# Patient Record
Sex: Female | Born: 1980 | Hispanic: Yes | Marital: Married | Smoking: Never smoker
Health system: Southern US, Community
[De-identification: ages and names within clinical notes are randomized; demographics above are authoritative.]

## PROBLEM LIST (undated history)

## (undated) DIAGNOSIS — I10 Essential (primary) hypertension: Secondary | ICD-10-CM

## (undated) DIAGNOSIS — O134 Gestational [pregnancy-induced] hypertension without significant proteinuria, complicating childbirth: Secondary | ICD-10-CM

## (undated) DIAGNOSIS — F419 Anxiety disorder, unspecified: Secondary | ICD-10-CM

## (undated) DIAGNOSIS — M069 Rheumatoid arthritis, unspecified: Secondary | ICD-10-CM

## (undated) DIAGNOSIS — E669 Obesity, unspecified: Secondary | ICD-10-CM

## (undated) DIAGNOSIS — F32A Depression, unspecified: Secondary | ICD-10-CM

## (undated) DIAGNOSIS — F329 Major depressive disorder, single episode, unspecified: Secondary | ICD-10-CM

## (undated) HISTORY — DX: Depression, unspecified: F32.A

## (undated) HISTORY — DX: Major depressive disorder, single episode, unspecified: F32.9

## (undated) HISTORY — DX: Obesity, unspecified: E66.9

## (undated) HISTORY — DX: Gestational (pregnancy-induced) hypertension without significant proteinuria, complicating childbirth: O13.4

---

## 2007-04-12 HISTORY — PX: TUBAL LIGATION: SHX77

## 2018-01-10 ENCOUNTER — Ambulatory Visit: Payer: Self-pay | Admitting: Licensed Clinical Social Worker

## 2018-01-10 DIAGNOSIS — F32A Depression, unspecified: Secondary | ICD-10-CM

## 2018-01-10 DIAGNOSIS — F329 Major depressive disorder, single episode, unspecified: Secondary | ICD-10-CM
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
## 2018-01-23 ENCOUNTER — Ambulatory Visit: Payer: Self-pay | Admitting: Licensed Clinical Social Worker

## 2018-01-23 DIAGNOSIS — F329 Major depressive disorder, single episode, unspecified: Secondary | ICD-10-CM

## 2018-01-23 DIAGNOSIS — F32A Depression, unspecified: Secondary | ICD-10-CM
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
## 2018-02-06 ENCOUNTER — Ambulatory Visit: Payer: Self-pay | Admitting: Licensed Clinical Social Worker

## 2018-02-06 DIAGNOSIS — F32A Depression, unspecified: Secondary | ICD-10-CM

## 2018-02-06 DIAGNOSIS — F329 Major depressive disorder, single episode, unspecified: Secondary | ICD-10-CM
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
## 2018-02-10 ENCOUNTER — Emergency Department (HOSPITAL_COMMUNITY): Payer: Self-pay

## 2018-02-10 ENCOUNTER — Other Ambulatory Visit: Payer: Self-pay

## 2018-02-10 ENCOUNTER — Emergency Department (HOSPITAL_COMMUNITY)
Admission: EM | Admit: 2018-02-10 | Discharge: 2018-02-10 | Disposition: A | Discharge status: Home / Self Care | Payer: Self-pay | Attending: Emergency Medicine | Admitting: Emergency Medicine

## 2018-02-10 ENCOUNTER — Encounter (HOSPITAL_COMMUNITY): Payer: Self-pay | Admitting: Emergency Medicine

## 2018-02-10 DIAGNOSIS — R110 Nausea: Secondary | ICD-10-CM | POA: Insufficient documentation

## 2018-02-10 DIAGNOSIS — I10 Essential (primary) hypertension: Secondary | ICD-10-CM | POA: Insufficient documentation

## 2018-02-10 DIAGNOSIS — R1084 Generalized abdominal pain: Secondary | ICD-10-CM | POA: Insufficient documentation

## 2018-02-10 HISTORY — DX: Essential (primary) hypertension: I10

## 2018-02-10 LAB — URINALYSIS, ROUTINE W REFLEX MICROSCOPIC
Bilirubin Urine: NEGATIVE
GLUCOSE, UA: NEGATIVE mg/dL
HGB URINE DIPSTICK: NEGATIVE
Ketones, ur: NEGATIVE mg/dL
NITRITE: NEGATIVE
PH: 6 (ref 5.0–8.0)
PROTEIN: NEGATIVE mg/dL
Specific Gravity, Urine: 1.012 (ref 1.005–1.030)

## 2018-02-10 LAB — CBC
HCT: 30.5 % — ABNORMAL LOW (ref 36.0–46.0)
HEMOGLOBIN: 8.4 g/dL — AB (ref 12.0–15.0)
MCH: 17.6 pg — ABNORMAL LOW (ref 26.0–34.0)
MCHC: 27.5 g/dL — ABNORMAL LOW (ref 30.0–36.0)
MCV: 64.1 fL — ABNORMAL LOW (ref 80.0–100.0)
PLATELETS: 310 10*3/uL (ref 150–400)
RBC: 4.76 MIL/uL (ref 3.87–5.11)
RDW: 19.3 % — ABNORMAL HIGH (ref 11.5–15.5)
WBC: 8.6 10*3/uL (ref 4.0–10.5)
nRBC: 0 % (ref 0.0–0.2)

## 2018-02-10 LAB — I-STAT BETA HCG BLOOD, ED (MC, WL, AP ONLY): I-stat hCG, quantitative: <5 m[IU]/mL (ref <5)

## 2018-02-10 LAB — COMPREHENSIVE METABOLIC PANEL
ALT: 19 U/L (ref 0–44)
ANION GAP: 8 (ref 5–15)
AST: 19 U/L (ref 15–41)
Albumin: 4.1 g/dL (ref 3.5–5.0)
Alkaline Phosphatase: 86 U/L (ref 38–126)
BILIRUBIN TOTAL: 0.4 mg/dL (ref 0.3–1.2)
BUN: 12 mg/dL (ref 6–20)
CALCIUM: 8.9 mg/dL (ref 8.9–10.3)
CO2: 24 mmol/L (ref 22–32)
Chloride: 105 mmol/L (ref 98–111)
Creatinine, Ser: 0.68 mg/dL (ref 0.44–1.00)
GFR calc Af Amer: >60 mL/min (ref >60)
Glucose, Bld: 92 mg/dL (ref 70–99)
POTASSIUM: 3.8 mmol/L (ref 3.5–5.1)
Sodium: 137 mmol/L (ref 135–145)
TOTAL PROTEIN: 8.3 g/dL — AB (ref 6.5–8.1)

## 2018-02-10 LAB — LIPASE, BLOOD: Lipase: 41 U/L (ref 11–51)

## 2018-02-10 MED ORDER — IOPAMIDOL (ISOVUE-300) INJECTION 61%
INTRAVENOUS | Status: AC
  Filled 2018-02-10: qty 100

## 2018-02-10 MED ORDER — SUCRALFATE 1 G PO TABS: 1.0000 g | ORAL_TABLET | Freq: Three times a day (TID) | ORAL | 0 refills | Status: DC

## 2018-02-10 MED ORDER — ONDANSETRON 4 MG PO TBDP: 4.0000 mg | ORAL_TABLET | Freq: Three times a day (TID) | ORAL | 0 refills | Status: DC | PRN

## 2018-02-10 MED ORDER — ONDANSETRON HCL 4 MG/2ML IJ SOLN
4.0000 mg | Freq: Once | INTRAMUSCULAR | Status: AC
  Administered 2018-02-10: 4 mg via INTRAVENOUS
  Filled 2018-02-10: qty 2

## 2018-02-10 MED ORDER — MORPHINE SULFATE (PF) 4 MG/ML IV SOLN
4.0000 mg | Freq: Once | INTRAVENOUS | Status: AC
  Administered 2018-02-10: 4 mg via INTRAVENOUS
  Filled 2018-02-10: qty 1

## 2018-02-10 MED ORDER — IOPAMIDOL (ISOVUE-300) INJECTION 61%
100.0000 mL | Freq: Once | INTRAVENOUS | Status: AC | PRN
  Administered 2018-02-10: 100 mL via INTRAVENOUS

## 2018-02-10 MED ORDER — SODIUM CHLORIDE 0.9 % IV BOLUS
1000.0000 mL | Freq: Once | INTRAVENOUS | Status: AC
  Administered 2018-02-10: 1000 mL via INTRAVENOUS

## 2018-02-10 MED ORDER — SODIUM CHLORIDE 0.9 % IJ SOLN
INTRAMUSCULAR | Status: AC
  Filled 2018-02-10: qty 50

## 2018-02-10 NOTE — ED Provider Notes (Signed)
[facility] DEPT Provider Note   CSN: [id number] Arrival date & time: 02/10/18  1058   History   Chief Complaint Chief Complaint  Patient presents with  . Abdominal Pain    HPI Janet Cardenas is a 37 y.o. female with past medical history significant for hypertension who presents for evaluation of pain and nausea.  Patient states this has been going on intermittently over the last 4 months.  Patient states she was seen approximately 3 months ago and given omeprazole.  Patient states at that time that resolved her symptoms.  She states her pain " will go away."  Pain has not changed over the last 4 months.  Rates her pain a 5/10.  Denies radiation of pain.  States her pain located "all over her stomach."  Denies aggravating or alleviating factors.  Pain does not get worse with eating.  Denies fever, chills, vomiting, headache, dizziness, lightheadedness, cough, hemoptysis, diarrhea, constipation, melena, bright red blood per rectum, dysuria, vaginal discharge.  History obtained from patient and family members.  Medical interpreter was used.  HPI  Past Medical History:  Diagnosis Date  . Hypertension     There are no active problems to display for this patient.   History reviewed. No pertinent surgical history.   OB History   None      Home Medications    Prior to Admission medications   Medication Sig Start Date End Date Taking? Authorizing Provider  ibuprofen (ADVIL,MOTRIN) 200 MG tablet Take 400 mg by mouth every 6 (six) hours as needed for mild pain.   Yes [provider]  ondansetron (ZOFRAN ODT) 4 MG disintegrating tablet Take 1 tablet (4 mg total) by mouth every 8 (eight) hours as needed for nausea or vomiting. 02/10/18   Vedh Ptacek A, PA-C  sucralfate (CARAFATE) 1 g tablet Take 1 tablet (1 g total) by mouth 4 (four) times daily -  with meals and at bedtime. 02/10/18   Toddy Boyd A, PA-C    Family  History History reviewed. No pertinent family history.  Social History Social History   Tobacco Use  . Smoking status: Never Smoker  . Smokeless tobacco: Never Used  Substance Use Topics  . Alcohol use: Not Currently  . Drug use: Never     Allergies   Patient has no known allergies.   Review of Systems Review of Systems  Constitutional: Negative.   HENT: Negative.   Respiratory: Negative.   Cardiovascular: Negative.   Gastrointestinal: Positive for abdominal pain and nausea. Negative for abdominal distention, anal bleeding, blood in stool, constipation, diarrhea, rectal pain and vomiting.  Genitourinary: Negative.   Musculoskeletal: Negative.   Skin: Negative.   Neurological: Negative.   All other systems reviewed and are negative.    Physical Exam Updated Vital Signs BP 116/70   Pulse 62   Temp 97.7 F (36.5 C) (Oral)   Resp 14   Ht _0  (1.626 m)   Wt 99.8 kg   LMP 01/18/2018 (Exact Date) Comment: neg hcg  SpO2 100%   BMI 37.76 kg/m   Physical Exam  Constitutional: She appears well-developed and well-nourished.  Non-toxic appearance. She does not appear ill. No distress.  HENT:  Head: Atraumatic.  Mouth/Throat: Oropharynx is clear and moist.  Eyes: Pupils are equal, round, and reactive to light.  Neck: Normal range of motion.  Cardiovascular: Normal rate, regular rhythm, normal heart sounds and intact distal pulses. Exam reveals no gallop and no friction  rub.  No murmur heard. Pulmonary/Chest: Effort normal and breath sounds normal. No stridor. No respiratory distress. She has no wheezes. She has no rhonchi. She has no rales. She exhibits no tenderness.  Abdominal: Soft. Normal appearance and bowel sounds are normal. She exhibits no shifting dullness, no distension, no pulsatile liver, no fluid wave, no abdominal bruit, no ascites, no pulsatile midline mass and no mass. There is no hepatosplenomegaly. There is generalized tenderness. There is no rigidity,  no rebound, no guarding, no CVA tenderness, no tenderness at McBurney's point and negative Murphy's sign. No hernia.  Musculoskeletal: Normal range of motion.  Neurological: She is alert.  Skin: Skin is warm and dry.  Psychiatric: She has a normal mood and affect.  Nursing note and vitals reviewed.    ED Treatments / Results  Labs (all labs ordered are listed, but only abnormal results are displayed) Labs Reviewed  COMPREHENSIVE METABOLIC PANEL - Abnormal; Notable for the following components:      Result Value   Total Protein 8.3 (*)    All other components within normal limits  CBC - Abnormal; Notable for the following components:   Hemoglobin 8.4 (*)    HCT 30.5 (*)    MCV 64.1 (*)    MCH 17.6 (*)    MCHC 27.5 (*)    RDW 19.3 (*)    All other components within normal limits  URINALYSIS, ROUTINE W REFLEX MICROSCOPIC - Abnormal; Notable for the following components:   APPearance HAZY (*)    Leukocytes, UA SMALL (*)    Bacteria, UA RARE (*)    All other components within normal limits  URINE CULTURE  LIPASE, BLOOD  I-STAT BETA HCG BLOOD, ED ([facility], [facility], [facility] ONLY)    EKG None  Radiology Ct Abdomen Pelvis W Contrast  Result Date: 02/10/2018 CLINICAL DATA:  Acute generalized abdominal pain EXAM: CT ABDOMEN AND PELVIS WITH CONTRAST TECHNIQUE: Multidetector CT imaging of the abdomen and pelvis was performed using the standard protocol following bolus administration of intravenous contrast. CONTRAST:  138m ISOVUE-300 IOPAMIDOL (ISOVUE-300) INJECTION 61% COMPARISON:  None. FINDINGS: Lower chest: Multiple calcified bilateral lobe pulmonary nodule likely reflecting sequela of prior granulomatous disease. Hepatobiliary: No focal liver abnormality is seen. No gallstones, gallbladder wall thickening, or biliary dilatation. Pancreas: Unremarkable. No pancreatic ductal dilatation or surrounding inflammatory changes. Spleen: Normal in size without focal abnormality. Adrenals/Urinary Tract:  Adrenal glands are unremarkable. No urolithiasis or obstructive uropathy. No renal mass. Atrophic left kidney with scarring. Bladder is unremarkable. Stomach/Bowel: Stomach is within normal limits. Appendix appears normal. No evidence of bowel wall thickening, distention, or inflammatory changes. Vascular/Lymphatic: No significant vascular findings are present. No enlarged abdominal or pelvic lymph nodes. Reproductive: Uterus and bilateral adnexa are unremarkable. Other: No abdominal wall hernia or abnormality. No abdominopelvic ascites. Musculoskeletal: No acute osseous abnormality. No aggressive osseous lesion. IMPRESSION: 1. No acute abdominal or pelvic pathology. 2. Atrophic left kidney. Electronically Signed   By: HKathreen Devoid  On: 02/10/2018 14:43    Procedures Procedures (including critical care time)  Medications Ordered in ED Medications  iopamidol (ISOVUE-300) 61 % injection (has no administration in time range)  sodium chloride 0.9 % injection (has no administration in time range)  sodium chloride 0.9 % bolus 1,000 mL (0 mLs Intravenous Stopped 02/10/18 1632)  ondansetron (ZOFRAN) injection 4 mg (4 mg Intravenous Given 02/10/18 1314)  morphine 4 MG/ML injection 4 mg (4 mg Intravenous Given 02/10/18 1314)  iopamidol (ISOVUE-300) 61 % injection 100 mL (  100 mLs Intravenous Contrast Given 02/10/18 1349)     Initial Impression / Assessment and Plan / ED Course  I have reviewed the triage vital signs and the nursing notes.  Pertinent labs & imaging results that were available during my care of the patient were reviewed by me and considered in my medical decision making (see chart for details).  37 year old female who appears otherwise well presents for evaluation of abdominal pain and nausea.  Afebrile, nonseptic, non-ill-appearing.  History of similar symptoms over the last 4 months.  Denies aggravating or alleviating factors.  Was seen approximately 3 months ago and was given omeprazole.   Her symptoms resolved at that time.  Mild generalized abdominal pain.  No focal pain.  Negative Murphy sign, negative McBurney's point. Symptoms do not change with food intake.  Patient in no apparent distress.  Patient's pain and other symptoms adequately managed in emergency department.  Fluid bolus given.  Labs, imaging and vitals reviewed.  Labs without leukocytosis, Hemoglobin 8.4, Patient states she has heavy menstrual cycles and has chronic anemia. She is supposed to be taking iron supplementation however does not take on a regular basis. Discussed with her resuming iron supplementation. Denies dizziness, lightheadedness. Denies melena or BRBPR. Lipase 41, HCG negative, metabolic panel without any electrolyte abnormalities, normal kidney and liver function, No elevation in AST, ALT, alk phos or bilirubin. Urine with small leukocytes and rare bacteria. No urinary symptoms. Will send culture.  Patient able to tolerate PO intake in the department without difficulty.  Patient does not meet SIRS or Sepsis criteria.  On repeat exam patient does not have a surgical abdomin and there are no peritoneal signs.  No indication of appendicitis, bowel obstruction, bowel perforation, cholecystitis, diverticulitis, PID or ectopic pregnancy.  Patient discharged home with symptomatic treatment and given strict instructions for follow-up with their primary care physician.  I have also discussed reasons to return immediately to the ER.  Stable for dc home at this time. Patient expresses understanding and agrees with plan.   Treatment and plan discussed with patient and family via medical interpretor.   Final Clinical Impressions(s) / ED Diagnoses   Final diagnoses:  Generalized abdominal pain  Nausea    ED Discharge Orders         Ordered    ondansetron (ZOFRAN ODT) 4 MG disintegrating tablet  Every 8 hours PRN     02/10/18 1625    sucralfate (CARAFATE) 1 g tablet  3 times daily with meals & bedtime      02/10/18 1625           Schyler Butikofer A, PA-C 02/10/18 1638    Davonna Belling, MD 02/11/18 1554

## 2018-02-10 NOTE — Discharge Instructions (Signed)
You were evaluated today for nausea and abdominal pain.  Your CT scan was negative. Your lab work was negative. We have cultured your Urine and we will call you if it is positive. Please take the medications we have prescribed you.  Follow-up with the community clinic we have referred  you too.  Return to the ED with any new or worsening symptoms

## 2018-02-10 NOTE — ED Notes (Signed)
EDPA Provider at bedside. 

## 2018-02-10 NOTE — ED Triage Notes (Signed)
(  Interpreter service used) Pt c/o abdominal pain at umbilicus. Pt reports intermittent pain that causes vomiting and dizziness. Pt states she has had this pain x 4 months and was seen for it and given amoxicillin, flagyl and omeprazole and pain has returned. Pt states she was pain free while taking meds, but pain returns when off of meds.

## 2018-02-13 LAB — URINE CULTURE: Culture: 70000 — AB

## 2018-02-14 ENCOUNTER — Telehealth: Payer: Self-pay | Admitting: Emergency Medicine

## 2018-02-14 NOTE — Telephone Encounter (Signed)
Post ED Visit - Positive Culture Follow-up  Culture report reviewed by antimicrobial stewardship pharmacist:  []  Enzo Bi, Pharm.D. []  Celedonio Miyamoto, Pharm.D., BCPS AQ-ID []  Garvin Fila, Pharm.D., BCPS []  Georgina Pillion, Pharm.D., BCPS []  [place], [address].D., BCPS, AAHIVP []  Estella Husk, Pharm.D., BCPS, AAHIVP []  Lysle Pearl, PharmD, BCPS []  Phillips Climes, PharmD, BCPS []  Agapito Games, PharmD, BCPS []  Verlan Friends, PharmD Michel Harrow PharmD  Positive urine culture Treated with none, asymptomatic, organism sensitive to the same and no further patient follow-up is required at this time.  Berle Mull 02/14/2018, 10:29 AM

## 2018-02-14 NOTE — Progress Notes (Signed)
ED Antimicrobial Stewardship Positive Culture Follow Up   Janet Cardenas is an 37 y.o. female who presented to [facility] on 02/10/2018 with a chief complaint of  Chief Complaint  Patient presents with  . Abdominal Pain    Recent Results (from the past 720 hour(s))  Urine culture     Status: Abnormal   Collection Time: 02/10/18 12:08 PM  Result Value Ref Range Status   Specimen Description   Final    URINE, CLEAN CATCH Performed at [facility], [address], [place], [place] [address]    Special Requests   Final    NONE Performed at [facility], [address] [place], [place] [address]    Culture (A)  Final    70,000 COLONIES/mL ESCHERICHIA COLI Confirmed Extended Spectrum Beta-Lactamase Producer (ESBL).  In bloodstream infections from ESBL organisms, carbapenems are preferred over piperacillin/tazobactam. They are shown to have a lower risk of mortality.    Report Status 02/13/2018 FINAL  Final   Organism ID, Bacteria ESCHERICHIA COLI (A)  Final      Susceptibility   Escherichia coli - MIC*    AMPICILLIN >=32 RESISTANT Resistant     CEFAZOLIN >=64 RESISTANT Resistant     CEFTRIAXONE >=64 RESISTANT Resistant     CIPROFLOXACIN >=4 RESISTANT Resistant     GENTAMICIN >=16 RESISTANT Resistant     IMIPENEM <=0.25 SENSITIVE Sensitive     NITROFURANTOIN <=16 SENSITIVE Sensitive     TRIMETH/SULFA <=20 SENSITIVE Sensitive     AMPICILLIN/SULBACTAM >=32 RESISTANT Resistant     PIP/TAZO 8 SENSITIVE Sensitive     Extended ESBL POSITIVE Resistant     * 70,000 COLONIES/mL ESCHERICHIA COLI    [x]  Patient discharged originally without antimicrobial agent and treatment is not indicated  New antibiotic prescription: Not applicable, no urinary symptoms, no treatment needed. Asymptomatic Bacturia.   ED Provider: Frederik Pear   Thank you for allowing pharmacy to be a part of this patient's care.  Bradley Ferris, PharmD 02/14/2018  9:57 AM PGY-1 Pharmacy Resident Monday - Friday phone -  [phone] Saturday - Sunday phone - [phone]

## 2018-03-04 ENCOUNTER — Encounter: Payer: Self-pay | Admitting: Internal Medicine

## 2018-03-05 ENCOUNTER — Ambulatory Visit: Payer: Self-pay | Admitting: Internal Medicine

## 2018-03-05 ENCOUNTER — Encounter: Payer: Self-pay | Admitting: Internal Medicine

## 2018-03-05 VITALS — BP 124/80 | HR 82 | Resp 12 | Ht 64.0 in | Wt 219.0 lb

## 2018-03-05 DIAGNOSIS — G8929 Other chronic pain: Secondary | ICD-10-CM

## 2018-03-05 DIAGNOSIS — E669 Obesity, unspecified: Secondary | ICD-10-CM | POA: Insufficient documentation

## 2018-03-05 DIAGNOSIS — Z23 Encounter for immunization: Secondary | ICD-10-CM

## 2018-03-05 DIAGNOSIS — M25562 Pain in left knee: Secondary | ICD-10-CM

## 2018-03-05 MED ORDER — MELOXICAM 15 MG PO TABS: 15.0000 mg | ORAL_TABLET | Freq: Every day | ORAL | 4 refills | Status: DC

## 2018-03-05 NOTE — Progress Notes (Signed)
Subjective:   Patient ID: Janet Cardenas, female    DOB: 12/15/1980, 37 y.o.   MRN: [id number]  HPI   Here to establish.  1.  Left knee pain: Has been a problem for about 1 year.  Has been using some sort of OTC ointment.  Has Menthol in it and helps.  Uses only with pain.  Generally 2-3 times weekly. Also, uses ibuprofen 400 mg maybe twice weekly for the pain as well--also helps. Hurts more after sitting for prolonged period of time. No history of injury.   Entire anterior knee hurts and medial joint line in particular.   Does not kneel or deep knee bend much.  Describes a burning sensation in her knee as well as a tightness at times.   No sense of "catching" in her knee. Knee does swell at times.    2.  Obesity:   3 children:  38 yo, 38 yo, 38 yo.  Diet:  Up at 7 a.m.  Eats first around 10 a.m.:  Eats pan dulce and coffee with milk after dropping off daughter at bus stop.  1 p.m.:  Rice with chicken, beans, no vegetables. Drinks water.  Will have a melon smoothie:  Melon, banana, whole milk.  6-7 p.m.:  Eats whatever she ate at 1 p.m.  Does have a vegetable and fried chicken.   Water.    Does not eat after dinner.   2.  History of depression after coming here from [place].  She feels depressed at times.   At times, would like to go back to [place]. Has not made community here.   Has not been able to learn English and is not really motivated to do so as she wants to go back to her country of origin.  Current Meds  Medication Sig  . ibuprofen (ADVIL,MOTRIN) 200 MG tablet Take 400 mg by mouth every 6 (six) hours as needed for mild pain.  . Iron-FA-B Cmp-C-Biot-Probiotic (FUSION PLUS) CAPS Take 1 capsule by mouth daily.  . ondansetron (ZOFRAN ODT) 4 MG disintegrating tablet Take 1 tablet (4 mg total) by mouth every 8 (eight) hours as needed for nausea or vomiting.  . sucralfate (CARAFATE) 1 g tablet Take 1 tablet (1 g total) by mouth 4 (four)  times daily -  with meals and at bedtime.   No Known Allergies   Past Medical History:  Diagnosis Date  . Depression   . Hypertension   . Obesity (BMI 35.0-39.9 without comorbidity)    No past surgical history on file.   Social History   Socioeconomic History  . Marital status: Married    Spouse name: Not on file  . Number of children: Not on file  . Years of education: Not on file  . Highest education level: Not on file  Occupational History  . Not on file  Social Needs  . Financial resource strain: Not on file  . Food insecurity:    Worry: Not on file    Inability: Not on file  . Transportation needs:    Medical: Not on file    Non-medical: Not on file  Tobacco Use  . Smoking status: Never Smoker  . Smokeless tobacco: Never Used  Substance and Sexual Activity  . Alcohol use: Not Currently  . Drug use: Never  . Sexual activity: Not on file  Lifestyle  . Physical activity:    Days per week: Not on file    Minutes per session:  Not on file  . Stress: Not on file  Relationships  . Social connections:    Talks on phone: Not on file    Gets together: Not on file    Attends religious service: Not on file    Active member of club or organization: Not on file    Attends meetings of clubs or organizations: Not on file    Relationship status: Not on file  . Intimate partner violence:    Fear of current or ex partner: Not on file    Emotionally abused: Not on file    Physically abused: Not on file    Forced sexual activity: Not on file  Other Topics Concern  . Not on file  Social History Narrative   Lives at home with husband and children.       Review of Systems    Objective:  Physical Exam  Obese, NAD HEENT: PERRL, EOMI Neck:  Supple, No adenopathy, no thyromegaly Chest:  CTA CV: RRR without murmur or rub. Radial pulses normal and equal Left knee with joint line tenderness at collateral ligament both medially and laterally.  Tender along anterior  lateral joint line and entire medial joint line as well.  No effusion.  NT on palpation of patella. Full ROM     Assessment & Plan:  1.  Left knee pain:  Send for Xrays.  Meloxicam 15 mg daily with meal for pain as needed. To work on lifestyle changes for weight loss.  Consider swimming or stationary bike with knee complaints.  Discussed would help with knee pain.  2.  HM:  Tdap today.  3.  History of Anemia:  followup in 1 month for CBC  4.  Obesity:  To work on weight loss.

## 2018-03-05 NOTE — Patient Instructions (Signed)

## 2018-04-06 ENCOUNTER — Other Ambulatory Visit: Payer: Self-pay

## 2018-04-06 DIAGNOSIS — D649 Anemia, unspecified: Secondary | ICD-10-CM

## 2018-04-07 LAB — CBC WITH DIFFERENTIAL/PLATELET
BASOS: 0 %
Basophils Absolute: 0 10*3/uL (ref 0.0–0.2)
EOS (ABSOLUTE): 0.2 10*3/uL (ref 0.0–0.4)
Eos: 3 %
Hematocrit: 30.1 % — ABNORMAL LOW (ref 34.0–46.6)
Hemoglobin: 9.2 g/dL — ABNORMAL LOW (ref 11.1–15.9)
IMMATURE GRANULOCYTES: 0 %
Immature Grans (Abs): 0 10*3/uL (ref 0.0–0.1)
LYMPHS ABS: 2 10*3/uL (ref 0.7–3.1)
Lymphs: 25 %
MCH: 19.7 pg — ABNORMAL LOW (ref 26.6–33.0)
MCHC: 30.6 g/dL — AB (ref 31.5–35.7)
MCV: 64 fL — ABNORMAL LOW (ref 79–97)
MONOS ABS: 0.5 10*3/uL (ref 0.1–0.9)
Monocytes: 6 %
NEUTROS PCT: 66 %
Neutrophils Absolute: 5.3 10*3/uL (ref 1.4–7.0)
PLATELETS: 304 10*3/uL (ref 150–450)
RBC: 4.68 x10E6/uL (ref 3.77–5.28)
RDW: 20.5 % — AB (ref 12.3–15.4)
WBC: 8.1 10*3/uL (ref 3.4–10.8)

## 2018-05-10 ENCOUNTER — Other Ambulatory Visit: Payer: Self-pay

## 2018-05-14 ENCOUNTER — Other Ambulatory Visit: Payer: Self-pay

## 2018-05-15 ENCOUNTER — Other Ambulatory Visit: Payer: Self-pay

## 2018-05-15 DIAGNOSIS — D649 Anemia, unspecified: Secondary | ICD-10-CM

## 2018-05-16 LAB — CBC WITH DIFFERENTIAL/PLATELET
BASOS: 0 %
Basophils Absolute: 0 10*3/uL (ref 0.0–0.2)
EOS (ABSOLUTE): 0.1 10*3/uL (ref 0.0–0.4)
EOS: 2 %
HEMATOCRIT: 36.8 % (ref 34.0–46.6)
Hemoglobin: 11.4 g/dL (ref 11.1–15.9)
IMMATURE GRANS (ABS): 0 10*3/uL (ref 0.0–0.1)
Immature Granulocytes: 0 %
LYMPHS: 27 %
Lymphocytes Absolute: 1.7 10*3/uL (ref 0.7–3.1)
MCH: 22.1 pg — ABNORMAL LOW (ref 26.6–33.0)
MCHC: 31 g/dL — ABNORMAL LOW (ref 31.5–35.7)
MCV: 71 fL — AB (ref 79–97)
MONOCYTES: 5 %
Monocytes Absolute: 0.3 10*3/uL (ref 0.1–0.9)
NEUTROS ABS: 4 10*3/uL (ref 1.4–7.0)
Neutrophils: 66 %
Platelets: 305 10*3/uL (ref 150–450)
RBC: 5.16 x10E6/uL (ref 3.77–5.28)
RDW: 22.1 % — ABNORMAL HIGH (ref 11.7–15.4)
WBC: 6.2 10*3/uL (ref 3.4–10.8)

## 2018-06-07 ENCOUNTER — Ambulatory Visit: Payer: Self-pay | Admitting: Internal Medicine

## 2018-06-27 ENCOUNTER — Other Ambulatory Visit: Payer: Self-pay

## 2018-07-11 ENCOUNTER — Other Ambulatory Visit: Payer: Self-pay

## 2018-08-10 ENCOUNTER — Other Ambulatory Visit: Payer: Self-pay

## 2018-08-10 DIAGNOSIS — D649 Anemia, unspecified: Secondary | ICD-10-CM

## 2018-08-11 LAB — CBC WITH DIFFERENTIAL/PLATELET
Basophils Absolute: 0 10*3/uL (ref 0.0–0.2)
Basos: 0 %
EOS (ABSOLUTE): 0.2 10*3/uL (ref 0.0–0.4)
Eos: 2 %
Hematocrit: 36.1 % (ref 34.0–46.6)
Hemoglobin: 11.5 g/dL (ref 11.1–15.9)
Immature Grans (Abs): 0 10*3/uL (ref 0.0–0.1)
Immature Granulocytes: 0 %
Lymphocytes Absolute: 2.1 10*3/uL (ref 0.7–3.1)
Lymphs: 29 %
MCH: 23.7 pg — ABNORMAL LOW (ref 26.6–33.0)
MCHC: 31.9 g/dL (ref 31.5–35.7)
MCV: 74 fL — ABNORMAL LOW (ref 79–97)
Monocytes Absolute: 0.5 10*3/uL (ref 0.1–0.9)
Monocytes: 7 %
Neutrophils Absolute: 4.4 10*3/uL (ref 1.4–7.0)
Neutrophils: 62 %
Platelets: 248 10*3/uL (ref 150–450)
RBC: 4.85 x10E6/uL (ref 3.77–5.28)
RDW: 15.5 % — ABNORMAL HIGH (ref 11.7–15.4)
WBC: 7.2 10*3/uL (ref 3.4–10.8)

## 2018-11-15 ENCOUNTER — Encounter: Payer: Self-pay | Admitting: Internal Medicine

## 2018-11-29 ENCOUNTER — Other Ambulatory Visit: Payer: Self-pay

## 2018-11-29 ENCOUNTER — Encounter: Payer: Self-pay | Admitting: Internal Medicine

## 2018-11-29 ENCOUNTER — Ambulatory Visit (INDEPENDENT_AMBULATORY_CARE_PROVIDER_SITE_OTHER): Payer: Self-pay | Admitting: Internal Medicine

## 2018-11-29 VITALS — BP 102/70 | HR 74 | Resp 12 | Ht 64.0 in | Wt 233.0 lb

## 2018-11-29 DIAGNOSIS — Z0000 Encounter for general adult medical examination without abnormal findings: Secondary | ICD-10-CM

## 2018-11-29 DIAGNOSIS — E669 Obesity, unspecified: Secondary | ICD-10-CM

## 2018-11-29 DIAGNOSIS — R5383 Other fatigue: Secondary | ICD-10-CM

## 2018-11-29 DIAGNOSIS — Z124 Encounter for screening for malignant neoplasm of cervix: Secondary | ICD-10-CM

## 2018-11-29 DIAGNOSIS — O134 Gestational [pregnancy-induced] hypertension without significant proteinuria, complicating childbirth: Secondary | ICD-10-CM | POA: Insufficient documentation

## 2018-11-29 DIAGNOSIS — F418 Other specified anxiety disorders: Secondary | ICD-10-CM
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
## 2018-12-04 LAB — CYTOLOGY - PAP

## 2018-12-06 ENCOUNTER — Other Ambulatory Visit: Payer: Self-pay

## 2018-12-06 DIAGNOSIS — R5383 Other fatigue: Secondary | ICD-10-CM

## 2018-12-06 DIAGNOSIS — Z0000 Encounter for general adult medical examination without abnormal findings: Secondary | ICD-10-CM

## 2018-12-07 LAB — CBC WITH DIFFERENTIAL/PLATELET
Basophils Absolute: 0 10*3/uL (ref 0.0–0.2)
Basos: 1 %
EOS (ABSOLUTE): 0.3 10*3/uL (ref 0.0–0.4)
Eos: 4 %
Hematocrit: 35.2 % (ref 34.0–46.6)
Hemoglobin: 11.2 g/dL (ref 11.1–15.9)
Immature Grans (Abs): 0 10*3/uL (ref 0.0–0.1)
Immature Granulocytes: 0 %
Lymphocytes Absolute: 2.1 10*3/uL (ref 0.7–3.1)
Lymphs: 33 %
MCH: 24 pg — ABNORMAL LOW (ref 26.6–33.0)
MCHC: 31.8 g/dL (ref 31.5–35.7)
MCV: 75 fL — ABNORMAL LOW (ref 79–97)
Monocytes Absolute: 0.4 10*3/uL (ref 0.1–0.9)
Monocytes: 7 %
Neutrophils Absolute: 3.4 10*3/uL (ref 1.4–7.0)
Neutrophils: 55 %
Platelets: 286 10*3/uL (ref 150–450)
RBC: 4.67 x10E6/uL (ref 3.77–5.28)
RDW: 14.5 % (ref 11.7–15.4)
WBC: 6.3 10*3/uL (ref 3.4–10.8)

## 2018-12-07 LAB — COMPREHENSIVE METABOLIC PANEL
ALT: 11 IU/L (ref 0–32)
AST: 16 IU/L (ref 0–40)
Albumin/Globulin Ratio: 1.3 (ref 1.2–2.2)
Albumin: 3.8 g/dL (ref 3.8–4.8)
Alkaline Phosphatase: 81 IU/L (ref 39–117)
BUN/Creatinine Ratio: 13 (ref 9–23)
BUN: 9 mg/dL (ref 6–20)
Bilirubin Total: <0.2 mg/dL (ref 0.0–1.2)
CO2: 21 mmol/L (ref 20–29)
Calcium: 8.8 mg/dL (ref 8.7–10.2)
Chloride: 103 mmol/L (ref 96–106)
Creatinine, Ser: 0.7 mg/dL (ref 0.57–1.00)
GFR calc Af Amer: 127 mL/min/{1.73_m2} (ref >59)
GFR calc non Af Amer: 110 mL/min/{1.73_m2} (ref >59)
Globulin, Total: 2.9 g/dL (ref 1.5–4.5)
Glucose: 91 mg/dL (ref 65–99)
Potassium: 3.9 mmol/L (ref 3.5–5.2)
Sodium: 140 mmol/L (ref 134–144)
Total Protein: 6.7 g/dL (ref 6.0–8.5)

## 2018-12-07 LAB — LIPID PANEL W/O CHOL/HDL RATIO
Cholesterol, Total: 176 mg/dL (ref 100–199)
HDL: 33 mg/dL — ABNORMAL LOW (ref >39)
LDL Calculated: 112 mg/dL — ABNORMAL HIGH (ref 0–99)
Triglycerides: 153 mg/dL — ABNORMAL HIGH (ref 0–149)
VLDL Cholesterol Cal: 31 mg/dL (ref 5–40)

## 2018-12-07 LAB — TSH: TSH: 4.67 u[IU]/mL — ABNORMAL HIGH (ref 0.450–4.500)

## 2018-12-25 ENCOUNTER — Other Ambulatory Visit: Payer: Self-pay

## 2018-12-25 DIAGNOSIS — R5383 Other fatigue: Secondary | ICD-10-CM

## 2018-12-26 LAB — T4, FREE: Free T4: 0.92 ng/dL (ref 0.82–1.77)

## 2019-02-15 ENCOUNTER — Other Ambulatory Visit: Payer: Self-pay

## 2019-03-04 ENCOUNTER — Other Ambulatory Visit (INDEPENDENT_AMBULATORY_CARE_PROVIDER_SITE_OTHER): Payer: Self-pay

## 2019-03-04 ENCOUNTER — Other Ambulatory Visit: Payer: Self-pay

## 2019-03-04 DIAGNOSIS — Z79899 Other long term (current) drug therapy: Secondary | ICD-10-CM

## 2019-03-04 DIAGNOSIS — R5383 Other fatigue: Secondary | ICD-10-CM

## 2019-03-05 LAB — CBC WITH DIFFERENTIAL/PLATELET
Basophils Absolute: 0 10*3/uL (ref 0.0–0.2)
Basos: 0 %
EOS (ABSOLUTE): 0.2 10*3/uL (ref 0.0–0.4)
Eos: 2 %
Hematocrit: 35.9 % (ref 34.0–46.6)
Hemoglobin: 11.6 g/dL (ref 11.1–15.9)
Immature Grans (Abs): 0 10*3/uL (ref 0.0–0.1)
Immature Granulocytes: 0 %
Lymphocytes Absolute: 2.5 10*3/uL (ref 0.7–3.1)
Lymphs: 35 %
MCH: 24.5 pg — ABNORMAL LOW (ref 26.6–33.0)
MCHC: 32.3 g/dL (ref 31.5–35.7)
MCV: 76 fL — ABNORMAL LOW (ref 79–97)
Monocytes Absolute: 0.4 10*3/uL (ref 0.1–0.9)
Monocytes: 6 %
Neutrophils Absolute: 4 10*3/uL (ref 1.4–7.0)
Neutrophils: 57 %
Platelets: 246 10*3/uL (ref 150–450)
RBC: 4.73 x10E6/uL (ref 3.77–5.28)
RDW: 14.8 % (ref 11.7–15.4)
WBC: 7.1 10*3/uL (ref 3.4–10.8)

## 2019-03-05 LAB — T4, FREE: Free T4: 1.03 ng/dL (ref 0.82–1.77)

## 2019-03-05 LAB — TSH: TSH: 3.27 u[IU]/mL (ref 0.450–4.500)

## 2019-03-28 ENCOUNTER — Ambulatory Visit: Payer: Self-pay | Admitting: Internal Medicine

## 2019-04-01 ENCOUNTER — Encounter: Payer: Self-pay | Admitting: Internal Medicine

## 2019-04-01 DIAGNOSIS — R718 Other abnormality of red blood cells: Secondary | ICD-10-CM

## 2019-04-01 HISTORY — DX: Other abnormality of red blood cells: R71.8

## 2019-04-03 NOTE — Progress Notes (Signed)
Lvm for pt. To return cal to discuss lab results

## 2019-06-21 ENCOUNTER — Ambulatory Visit: Payer: Self-pay | Admitting: Internal Medicine

## 2019-06-21 ENCOUNTER — Ambulatory Visit
Admission: RE | Admit: 2019-06-21 | Discharge: 2019-06-21 | Disposition: A | Discharge status: Home / Self Care | Payer: No Typology Code available for payment source | Source: Ambulatory Visit | Attending: Internal Medicine | Admitting: Internal Medicine

## 2019-06-21 ENCOUNTER — Other Ambulatory Visit: Payer: Self-pay

## 2019-06-21 ENCOUNTER — Encounter: Payer: Self-pay | Admitting: Internal Medicine

## 2019-06-21 VITALS — BP 112/70 | HR 74 | Resp 12 | Ht 64.0 in | Wt 240.0 lb

## 2019-06-21 DIAGNOSIS — K5900 Constipation, unspecified: Secondary | ICD-10-CM

## 2019-06-21 DIAGNOSIS — R1012 Left upper quadrant pain: Secondary | ICD-10-CM

## 2019-06-21 MED ORDER — FAMOTIDINE 20 MG PO TABS: ORAL_TABLET | ORAL | 3 refills | Status: DC

## 2019-06-21 MED ORDER — BISACODYL 10 MG RE SUPP: 10.0000 mg | RECTAL | 0 refills | Status: DC | PRN

## 2019-06-21 NOTE — Progress Notes (Signed)
    Subjective:    Patient ID: Janet Cardenas, female   DOB: July 17, 1980, 39 y.o.   MRN: [id number]   HPI  Alcide Clever, family friend interprets  Lot of pain in left upper quadrant for over 1 month.  Comes and goes.  Seems to be worsening.  She feels hot at night and she feels like she may have a fever.  Describes a burning pain inside.   No burning or acid into throat.   When she eats, the pain occurs.  Fried foods are the worst, but can occur with any oral intake.  Has occurred with every meal since started. She does have associated nausea, but no vomiting.   No diarrhea, but does have small hard stools three times daily since pain started. No melena or hematochezia. Feels bloated  Has been taking Alka Seltzer, which does not help.  No NSAIDS, tylenol or other pain medication.  No pepto bismal or stool softeners.  Periods are irregular, which is typical for her.  She has gained substantial weight since 02/2018 and also states she does not believe she has lost any weight with this illness--did not weigh more than 240 prior to 1 month ago.  History of C/S and removal of left ovarian cyst as well as BTL in past.  She states she has her appendix and gallbladder  No outpatient medications have been marked as taking for the 06/21/19 encounter (Office Visit) with Julieanne Manson, MD.   No Known Allergies   Review of Systems    Objective:   BP 112/70 (BP Location: Left Arm, Patient Position: Sitting, Cuff Size: Large)   Pulse 74   Resp 12   Ht 5\' 4"  (1.626 m)   Wt 240 lb (108.9 kg)   LMP 05/22/2019   BMI 41.20 kg/m   Physical Exam  NAD Lungs:  CTA CV:  RRR without murmur or rub.  Radial and DP pulses normal and equal. Abd:  S, NT, No hepatomegaly.  Possibly splenomegaly vs stool in splenic flexure of colon.  Tender over the vague area of soft tissue mass.  No rebound or peritoneal signs.  Mildly tender over midepigastrium as well without sense of palpable  abnormality.  + BS Midline old surgical incisional scar from pubis to just below umbilicus.   Assessment & Plan  1.  LUQ pain:  Gastritis vs constipation:  CBC, CMP, Abdominal flat plate to look for large amount of stool.  Treat with Famotidine 40 mg daily and Dulcolax.  To increase fiber in diet and water intake daily.  2.  Obesity:  Discuss at follow up appt.  Follow up in 1 month

## 2019-06-22 LAB — CBC WITH DIFFERENTIAL/PLATELET
Basophils Absolute: 0 10*3/uL (ref 0.0–0.2)
Basos: 1 %
EOS (ABSOLUTE): 0.1 10*3/uL (ref 0.0–0.4)
Eos: 1 %
Hematocrit: 35.5 % (ref 34.0–46.6)
Hemoglobin: 11.8 g/dL (ref 11.1–15.9)
Immature Grans (Abs): 0 10*3/uL (ref 0.0–0.1)
Immature Granulocytes: 0 %
Lymphocytes Absolute: 1.3 10*3/uL (ref 0.7–3.1)
Lymphs: 23 %
MCH: 25.3 pg — ABNORMAL LOW (ref 26.6–33.0)
MCHC: 33.2 g/dL (ref 31.5–35.7)
MCV: 76 fL — ABNORMAL LOW (ref 79–97)
Monocytes Absolute: 0.5 10*3/uL (ref 0.1–0.9)
Monocytes: 9 %
Neutrophils Absolute: 3.6 10*3/uL (ref 1.4–7.0)
Neutrophils: 66 %
Platelets: 239 10*3/uL (ref 150–450)
RBC: 4.67 x10E6/uL (ref 3.77–5.28)
RDW: 14.4 % (ref 11.7–15.4)
WBC: 5.5 10*3/uL (ref 3.4–10.8)

## 2019-06-22 LAB — COMPREHENSIVE METABOLIC PANEL
ALT: 13 IU/L (ref 0–32)
AST: 16 IU/L (ref 0–40)
Albumin/Globulin Ratio: 1.2 (ref 1.2–2.2)
Albumin: 3.8 g/dL (ref 3.8–4.8)
Alkaline Phosphatase: 87 IU/L (ref 39–117)
BUN/Creatinine Ratio: 11 (ref 9–23)
BUN: 8 mg/dL (ref 6–20)
Bilirubin Total: <0.2 mg/dL (ref 0.0–1.2)
CO2: 19 mmol/L — ABNORMAL LOW (ref 20–29)
Calcium: 8.7 mg/dL (ref 8.7–10.2)
Chloride: 103 mmol/L (ref 96–106)
Creatinine, Ser: 0.73 mg/dL (ref 0.57–1.00)
GFR calc Af Amer: 120 mL/min/{1.73_m2} (ref >59)
GFR calc non Af Amer: 104 mL/min/{1.73_m2} (ref >59)
Globulin, Total: 3.1 g/dL (ref 1.5–4.5)
Glucose: 88 mg/dL (ref 65–99)
Potassium: 4.1 mmol/L (ref 3.5–5.2)
Sodium: 138 mmol/L (ref 134–144)
Total Protein: 6.9 g/dL (ref 6.0–8.5)

## 2019-10-30 ENCOUNTER — Telehealth: Payer: Self-pay | Admitting: Internal Medicine

## 2019-10-30 NOTE — Telephone Encounter (Signed)
Spoke with patient stated abdominal pain stills the same; not better or worse.  Regarding constipation patient stated is taking dulcolax pills and is able to have a bowel movement just when takes dulcolax. Patient stated is  taking it everyday. Weight F/U appointment scheduled for 12/25/19 @ 3:00 pm

## 2019-10-30 NOTE — Telephone Encounter (Signed)
Spoke with patient; she  stated when asked how does she is not better? What specifically is not better?  Patient stated " abdominal pain is always there; since I saw the Doctor for it pain stays the same - not better or worse".  Scheduled for 11/15/2019 @ 12:30 pm

## 2019-11-15 ENCOUNTER — Ambulatory Visit: Payer: Self-pay | Admitting: Internal Medicine

## 2019-11-15 ENCOUNTER — Other Ambulatory Visit: Payer: Self-pay

## 2019-11-15 ENCOUNTER — Encounter: Payer: Self-pay | Admitting: Internal Medicine

## 2019-11-15 VITALS — BP 110/78 | HR 68 | Resp 12 | Ht 64.0 in | Wt 241.0 lb

## 2019-11-15 DIAGNOSIS — Z6841 Body Mass Index (BMI) 40.0 and over, adult: Secondary | ICD-10-CM

## 2019-11-15 DIAGNOSIS — R1012 Left upper quadrant pain: Secondary | ICD-10-CM | POA: Insufficient documentation

## 2019-11-15 DIAGNOSIS — K5900 Constipation, unspecified: Secondary | ICD-10-CM

## 2019-11-15 DIAGNOSIS — J029 Acute pharyngitis, unspecified: Secondary | ICD-10-CM

## 2019-11-15 LAB — POC COVID19 BINAXNOW: SARS Coronavirus 2 Ag: NEGATIVE

## 2019-11-15 MED ORDER — METAMUCIL 48.57 % PO POWD: ORAL | 0 refills | Status: DC

## 2019-11-15 MED ORDER — FAMOTIDINE 20 MG PO TABS: ORAL_TABLET | ORAL | 3 refills | Status: DC

## 2019-11-15 NOTE — Patient Instructions (Signed)

## 2019-11-15 NOTE — Progress Notes (Signed)
Subjective:    Patient ID: Janet Cardenas, female   DOB: [birth year], 39 y.o.   MRN: [id number]   HPI   Interpreted, Janet Cardenas  1.  LUQ pain:  Discomfort is better.  When she has a BM, she feels better.  Her stools remain hard.  States she drinks a lot of fluid, but has not increased fiber.  She does not eat any vegetables or fruit.  She is using the Dulcolax--not clear how often. She [place] family are originally from [place] and they eat a fried food diet.   [place] thin when came to [place] but is obese now.   Used to walk a lot in [place] and does not here.   LUQ burning better with Famotidine.  If she misses, she does have some burning in LUQ.  However states she does not need a refill.  Apparently not taking daily as prescribed.    Has had nausea for past 5 days.  Feels the sensation of vomiting, but has not actually vomited.  Neck pain also with sore throat as below.  Has been taking Tylenol, last dose yesterday in the morning.  Helped a bit.  No myalgias or fevers.  No melena or hematochezia.    2.  Neck pain:  Points to left anterior cervical area.  States has had for 2-3 weeks.  No ear pain.  Has had a sore throat.  No congestion, cough, or sneezing.  No one else ill at home.  She does not work outside of home.   Does not hurt a lot currently. She did receive her COVID vaccine:  Pf[employer], the second on 09/23/19  Current Meds  Medication Sig  . bisacodyl (DULCOLAX) 10 MG suppository Place 1 suppository (10 mg total) rectally as needed for moderate constipation.  . famotidine (PEPCID) 20 MG tablet 2 tabs by mouth at bedtime nightly.   No Known Allergies   Review of Systems    Objective:   BP 110/78 (BP Location: Left Arm, Patient Position: Sitting, Cuff Size: Large)   Pulse 68   Resp 12   Ht 5\' 4"  (1.626 m)   Wt 241 lb (109.3 kg)   LMP 11/04/2019   BMI 41.37 kg/m   Physical Exam  NAD Does sniff and clear throat throughout exam.   States  due to wearing a mask. HEENT:  PERRL, EOMI, TMs pearly gray with dry flaky canals.  Nasal mucosa swollen with clear discharge.  Throat without injection or exudate.  No cobbling of posterior pharynx Neck:  Supple, No adenopathy, though tender over left anterior cervical area.  Mildly tender over left parotid gland, though no swelling or erythema. Chest:  CTA CV:  RRR without murmur or rub.   Abd:  S, + BS, mild LUQ and mid left abdominal tenderness, no rebound or peritoneal signs.   LE:  No edema    Assessment & Plan   1.  LUQ pain:  Some of this seems related to likely gastritis.  Majority likely related to chronic constipation and poor diet with low fiber.  Discussed diet--to make small goals with family and herself with eating habits. Discussed also the need to make goals for physical activity and gradually increase on a weekly basis.   Will refill Famotidine.  Does not sound like she is actually using daily, but does find it helpful  2.  Nausea, sore throat.  Unlikely COVID, but does have 3 children ages 12-20 at  home who are not vaccinated.  Rapid testing today. To encourage children to come on Monday to discuss vaccination with me.   Does appear to have likely a mild viral illness with URI and GI sx and findings.  Rapid COVID Negative

## 2019-12-25 ENCOUNTER — Ambulatory Visit: Payer: Self-pay | Admitting: Internal Medicine

## 2019-12-25 VITALS — BP 100/68 | HR 80 | Resp 12 | Ht 64.0 in | Wt 242.0 lb

## 2019-12-25 DIAGNOSIS — K5900 Constipation, unspecified: Secondary | ICD-10-CM

## 2019-12-25 DIAGNOSIS — R1012 Left upper quadrant pain: Secondary | ICD-10-CM

## 2019-12-25 DIAGNOSIS — F439 Reaction to severe stress, unspecified: Secondary | ICD-10-CM

## 2019-12-25 DIAGNOSIS — Z6841 Body Mass Index (BMI) 40.0 and over, adult: Secondary | ICD-10-CM

## 2019-12-25 NOTE — Progress Notes (Signed)
    Subjective:    Patient ID: Janet Cardenas, female   DOB: [birth year], 39 y.o.   MRN: [id number]   HPI  Janet Cardenas interprets Poor historian  1.  Obesity/LUQ pain/constipation:  Since last seen, has been walking 1/2 hour 4 times daily.    Breakfast:  Oatmeal with almond milk.  Eats oatmeal water--3 tablespoons oatmeal with 1 cup water and blends in pineapple the next day.   Sounds like close to 8 ounces of almond milk  Lunch:  Beans, 1 egg, avocado, mexican type cream cheese--queso oreado--large portion.  Has difficulty quantitating the latter.  7 p.m.:  Will eat a whole mango and  A banana.    States constipation is better, but still with LUQ discomfort.  Feels a lot of stress--at home alone.  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  She is also limited as she lives out in [place] and no bus service.  Her husband needs the car for work.   Also dealing with her mother in [place] being sick and there is nothing she can do to help.   Current Meds  Medication Sig   acetaminophen (TYLENOL) 500 MG tablet Take 500 mg by mouth every 6 (six) hours as needed.   bisacodyl (DULCOLAX) 10 MG suppository Place 1 suppository (10 mg total) rectally as needed for moderate constipation.   famotidine (PEPCID) 20 MG tablet 2 tabs by mouth at bedtime nightly.   Psyllium (METAMUCIL) 48.57 % POWD Mix in 8 oz of fluid and drink daily as directed   No Known Allergies   Review of Systems    Objective:   BP 100/68 (BP Location: Left Arm, Patient Position: Sitting, Cuff Size: Large)   Pulse 80   Resp 12   Ht 5\' 4"  (1.626 m)   Wt 242 lb (109.8 kg)   LMP 11/29/2019   BMI 41.54 kg/m   Physical Exam NAD Lungs:  CTA CV:  RRR without murmur or rub.  Radial and DP pulses normal and equal Abd:  S, NT, No HSM or masses, + BS   Assessment & Plan   Obesity:  weight unchanged:  encouraged to continue gradual increase of daily physical activity and  improved diet.  2.  Constipation and LUQ pain:  no findings on exam or labs to identify etiology other than constipation.  Continue lifestyle changes and medication/fiber laxative.  3.  Stress/lack of meaning for patient:  12/01/2019, LCSW.

## 2020-01-03 ENCOUNTER — Ambulatory Visit (INDEPENDENT_AMBULATORY_CARE_PROVIDER_SITE_OTHER): Payer: Self-pay | Admitting: Clinical

## 2020-01-03 DIAGNOSIS — F411 Generalized anxiety disorder: Secondary | ICD-10-CM

## 2020-01-03 DIAGNOSIS — F4310 Post-traumatic stress disorder, unspecified: Secondary | ICD-10-CM

## 2020-01-03 DIAGNOSIS — F331 Major depressive disorder, recurrent, moderate: Secondary | ICD-10-CM
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
## 2020-01-10 ENCOUNTER — Telehealth (INDEPENDENT_AMBULATORY_CARE_PROVIDER_SITE_OTHER): Payer: Self-pay | Admitting: Clinical

## 2020-01-10 DIAGNOSIS — F331 Major depressive disorder, recurrent, moderate: Secondary | ICD-10-CM

## 2020-01-10 DIAGNOSIS — F439 Reaction to severe stress, unspecified: Secondary | ICD-10-CM

## 2020-01-15 NOTE — Progress Notes (Signed)
   THERAPY PROGRESS NOTE  Session Time: 10:00-11:00am Participation Level: Active Behavioral Response: CasualAlertEuthymic Type of Therapy: Individual Therapy Treatment Goals addressed: Coping skills to manage depression and anxiety symptoms.    Purpose: LCSW met with client for routine individual therapy to work towards treatment goals: "talk about my problems with someone, maybe there is a solution to what I feel"  Intervention: LCSW  assessed for any significant events and assessed how she is doing today since assessment session. LCSW provided reflective listening as patient processed recent events.Due to this being first session, LCSW utilized session to begin therapeutic relationship by beginning to discuss therapy expectations, LCSW's ethics, and confidentiality. LCSW utilized handout "My strengths and qualities" LCSW utilized this handout to facilitate discussion to get to know patient further and identify her strengths. LCSW shared with patient another way to use this activity is to reflect on it when she begins to feel unpleasant or experiencing depression as it can help her look at her qualities and worth. LCSW assessed for SI/HI/command psychosis.   Effectiveness: Patient is alert x4 affect. Patient identified she is doing okay today. Patient participated in session sharing she had a funeral to go to this afternoon. Patient participated in the intervention by identifying her things she does well, her values, qualities of strength such as friendship, giving to others. Patient shared understanding how this activity used to be used when experiencing a depressive episode and liked her own responses. Progress towards goal is Ongoing. Patient denied active suicidal/homicidal/active psychosis.  Plan Patient offered next appointment for: 01/17/20 11am   Diagnosis: Major Depressive/Anxiety    Lujean Rave, LCSW 01/15/2020

## 2020-01-17 ENCOUNTER — Telehealth: Payer: Self-pay | Admitting: Clinical

## 2020-01-17 NOTE — Telephone Encounter (Signed)
LCSW contacted patient to apologize for missing today's scheduled appointment, left message if okay to change for Friday the 15th at 11am. Notified can return call on google voice or clinic to confirm.

## 2020-01-31 ENCOUNTER — Telehealth (INDEPENDENT_AMBULATORY_CARE_PROVIDER_SITE_OTHER): Payer: Self-pay | Admitting: Clinical

## 2020-01-31 DIAGNOSIS — F331 Major depressive disorder, recurrent, moderate: Secondary | ICD-10-CM

## 2020-01-31 DIAGNOSIS — F4310 Post-traumatic stress disorder, unspecified: Secondary | ICD-10-CM

## 2020-02-04 NOTE — Progress Notes (Signed)
   THERAPY PROGRESS NOTE  Session Time: 12:00pm-1:00pm via updox Participation Level: Active Behavioral Response: CasualAlert Type of Therapy: Individual Therapy Treatment Goals addressed: Coping skills to manage depression and anxiety symptoms.   Purpose: LCSW met with client for routine individual therapy to work towards treatment goals: Coping skills to manage depression and anxiety symptoms.  Intervention: LCSW met with patient via updox for routine individual therapy to work towards treatment goals: learning coping skills to manage depression and anxiety symptoms. LCSW provided reflective listening as patient processed worries and some trauma details leading her to feel like going back to her country. LCSW utilized intervention of CBT in session by discussing depression symptoms and its behavioral patterns patient identified with. LCSW assessed for SI/HI/command psychosis.  Effectiveness: It should be noted LCSW apologized for not providing therapy date of previous scheduled session. Patient expressed understanding.   Patient presents x4 affect. Patient checked in she is feeling good however did process her mother in law passed away and is currently worried for her own mothers wellbeing as she has no support from other family members back home. Reports she wants to go back and support her but her mom tells her it's dangerous. Patient reports her children also don't want to go back. Patient processed the frustration with danger both here and in her home country, and the ongoing reminders of her trauma.   Patient shared in session ongoing depression with recently two days ago stayed in bed, not eating and getting up until 3pm. Patient identified mornings are most difficult to get motivated reports only gets up to cook to eat, clean and cook for her kids and husband when they come home. Patient identified feels like "whats the point of getting up" reports "feel like less than trash" Patient  processed frustration with her depression of why it can't be fixed. LCSW validated patient's emotions and frustration with her depression, LCSW discussed with therapy will be able to learn coping skills to challenge it, and manage it to where it does not feel like it overpowering her.   LCSW and patient identified steps for her morning routine to explore motivation such as breakfast. Patient identified she enjoys cooking. Using cooking as motivation, LCSW asked patient what would she liked to cook for breakfast. Patient identified plaintains and beans. LCSW asked patient this weekend to buy her plantains to cook for a few days with the goal to get out of bed. LCSW discussed to use affirmation statements to motivate herself in the morning. Patient agreed to this plan for the week.    Intervention was effective as patient was able to process stressors, and ongoing symptoms as well seeing the patterns of CBT and working on behavioral patterns to influence feelings of motivation. Patient began to see understanding of how depression is and it can be managed. Progress towards goal is Ongoing. Patient denied active suicidal/homicidal/active psychosis.  Plan Patient offered next appointment for: 02/14/2020 via updox  12pm   Diagnosis: Major Depression, Recurrent severe    Lujean Rave, LCSW 02/04/2020

## 2020-02-14 ENCOUNTER — Telehealth: Payer: Self-pay | Admitting: Clinical

## 2020-02-18 ENCOUNTER — Telehealth (INDEPENDENT_AMBULATORY_CARE_PROVIDER_SITE_OTHER): Payer: Self-pay | Admitting: Clinical

## 2020-02-18 DIAGNOSIS — F4310 Post-traumatic stress disorder, unspecified: Secondary | ICD-10-CM

## 2020-02-20 ENCOUNTER — Encounter: Payer: Self-pay | Admitting: Internal Medicine

## 2020-02-20 ENCOUNTER — Ambulatory Visit: Payer: Self-pay | Admitting: Internal Medicine

## 2020-02-20 VITALS — BP 112/76 | HR 70 | Resp 12 | Ht 64.0 in | Wt 241.0 lb

## 2020-02-20 DIAGNOSIS — F32A Depression, unspecified: Secondary | ICD-10-CM

## 2020-02-20 DIAGNOSIS — Z0000 Encounter for general adult medical examination without abnormal findings: Secondary | ICD-10-CM

## 2020-02-20 DIAGNOSIS — Z1231 Encounter for screening mammogram for malignant neoplasm of breast: Secondary | ICD-10-CM

## 2020-02-20 DIAGNOSIS — E785 Hyperlipidemia, unspecified: Secondary | ICD-10-CM

## 2020-02-20 DIAGNOSIS — F339 Major depressive disorder, recurrent, unspecified: Secondary | ICD-10-CM | POA: Insufficient documentation

## 2020-02-20 NOTE — Progress Notes (Signed)
Subjective:    Patient ID: Janet Cardenas, female   DOB: 30-Dec-1980, 39 y.o.   MRN: [id number]   HPI   Janet Cardenas interprets  CPE without pap  1.  Pap:  Last 11/2018 and normal.  2.  Mammogram:  Never.  Maternal first cousin with history of breast cancer at age 61 yo.    3.  Osteoprevention:    She feels she can drink 3-4 cups milk daily for calcium and Vitamin D.  Not physically active daily. She feels she can start walking daily as well.  Mother with osteoporosis.    4.  Guaiac Cards:  Never.  5.  Colonoscopy:  Never.  No family history of colon cancer.    6.  Immunizations:   Immunization History  Administered Date(s) Administered   PFIZER SARS-COV-2 Vaccination 08/29/2019, 09/23/2019   Tdap 03/05/2018     7.  Glucose/Cholesterol:  Blood glucose fine in past.  Cholesterol with mildly high triglycerides and low HDL.    Lipid Panel     Component Value Date/Time   CHOL 176 12/06/2018 0941   TRIG 153 (H) 12/06/2018 0941   HDL 33 (L) 12/06/2018 0941   LDLCALC 112 (H) 12/06/2018 0941   LABVLDL 31 12/06/2018 0941     Current Meds  Medication Sig   acetaminophen (TYLENOL) 500 MG tablet Take 500 mg by mouth every 6 (six) hours as needed.   No Known Allergies   Past Medical History:  Diagnosis Date   Depression    Gestational (pregnancy-induced) hypertension without significant proteinuria, complicating childbirth    Obesity (BMI 35.0-39.9 without comorbidity)    RBC microcytosis 04/01/2019    Past Surgical History:  Procedure Laterality Date   CESAREAN SECTION  2009   TUBAL LIGATION  2009   With C/S     Family History  Problem Relation Age of Onset   Hypertension Mother    Osteoporosis Mother    Kidney disease Father        Reportedly due to side effects of meds for pulmonary disease   COPD Father    Social History   Socioeconomic History   Marital status: Married    Spouse name: Arvin Collard   Number of children: 3   Years of  education: 6   Highest education level: Not on file  Occupational History   Occupation: Housewife  Tobacco Use   Smoking status: Never Smoker   Smokeless tobacco: Never Used  Building services engineer Use: Never used  Substance and Sexual Activity   Alcohol use: Never   Drug use: Never   Sexual activity: Yes    Birth control/protection: Surgical  Other Topics Concern   Not on file  Social History Narrative   Lives at home with husband and children.   Social Determinants of Health   Financial Resource Strain:    Difficulty of Paying Living Expenses: Not on file  Food Insecurity: No Food Insecurity   Worried About Programme researcher, broadcasting/film/video in the Last Year: Never true   Ran Out of Food in the Last Year: Never true  Transportation Needs: No Transportation Needs   Lack of Transportation (Medical): No   Lack of Transportation (Non-Medical): No  Physical Activity:    Days of Exercise per Week: Not on file   Minutes of Exercise per Session: Not on file  Stress:    Feeling of Stress : Not on file  Social Connections:    Frequency of  Communication with Friends and Family: Not on file   Frequency of Social Gatherings with Friends and Family: Not on file   Attends Religious Services: Not on file   Active Member of Clubs or Organizations: Not on file   Attends Banker Meetings: Not on file   Marital Status: Not on file  Intimate Partner Violence: Not At Risk   Fear of Current or Ex-Partner: No   Emotionally Abused: No   Physically Abused: No   Sexually Abused: No     Review of Systems  Constitutional: Positive for fatigue (Bored--wants a job, but no transportation.  Counseling with Ileana regarding thi.).  Eyes: Positive for visual disturbance (Problems with vision at night--burning in particular.  Vision blurry at night.  ).  Genitourinary: Positive for menstrual problem (periods becoming late.).       Having hot flashes and night sweats.  Worried she will get pregnant,  though had BTL in 2009      Objective:   BP 112/76 (BP Location: Left Arm, Patient Position: Sitting, Cuff Size: Large)   Pulse 70   Resp 12   Ht 5\' 4"  (1.626 m)   Wt 241 lb (109.3 kg)   LMP 02/04/2020   BMI 41.37 kg/m   Physical Exam HENT:     Head: Normocephalic and atraumatic.     Right Ear: Tympanic membrane, ear canal and external ear normal.     Left Ear: Tympanic membrane, ear canal and external ear normal.     Nose: Nose normal.     Mouth/Throat:     Mouth: Mucous membranes are moist.     Pharynx: Oropharynx is clear.  Eyes:     Extraocular Movements: Extraocular movements intact.     Conjunctiva/sclera: Conjunctivae normal.     Pupils: Pupils are equal, round, and reactive to light.     Comments: Discs sharp bilaterally.  Cardiovascular:     Rate and Rhythm: Normal rate and regular rhythm.     Heart sounds: S1 normal and S2 normal. No murmur heard.   No friction rub. No S3 or S4 sounds.     Comments: No carotid bruits.  Carotid, radial, femoral, DP and PT pulses normal and equal.    Pulmonary:     Breath sounds: Normal breath sounds and air entry.  Chest:  Breasts:    Right: No inverted nipple, mass or nipple discharge.     Left: No inverted nipple, mass or nipple discharge.  Abdominal:     General: Bowel sounds are normal.     Palpations: Abdomen is soft. There is no hepatomegaly, splenomegaly or mass.     Hernia: No hernia is present.  Genitourinary:    Comments: Normal external female genitalia No uterine or adnexal mass or tenderness.   Musculoskeletal:        General: Normal range of motion.     Cervical back: Normal range of motion and neck supple.     Right lower leg: No edema.     Left lower leg: No edema.  Lymphadenopathy:     Head:     Right side of head: No submental or submandibular adenopathy.     Left side of head: No submental or submandibular adenopathy.     Cervical: No cervical adenopathy.     Upper Body:     Right upper body: No  supraclavicular or axillary adenopathy.     Left upper body: No supraclavicular or axillary adenopathy.     Lower Body:  No right inguinal adenopathy. No left inguinal adenopathy.  Skin:    General: Skin is warm.     Capillary Refill: Capillary refill takes less than 2 seconds.     Findings: No rash.  Neurological:     General: No focal deficit present.     Mental Status: She is alert and oriented to person, place, and time.     Cranial Nerves: Cranial nerves 2-12 are intact.     Sensory: Sensation is intact.     Motor: Motor function is intact.     Coordination: Coordination is intact.     Gait: Gait is intact.     Deep Tendon Reflexes: Reflexes are normal and symmetric.  Psychiatric:        Attention and Perception: Attention normal.        Mood and Affect: Mood and affect normal.        Speech: Speech normal.        Behavior: Behavior normal. Behavior is cooperative.   Morbidly obese--exam otherwise normal  Assessment & Plan   CPE without pap Guaiac cards x 3 to return in 2 weeks. CBC, CMP, FLP Mammogram ordered Encouraged COVID booster next Monday she is available to obtain.   2.  Social/Depression:  Would like a job.  Husband leaves for work at 5 a.m.  She has no transportation.  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  Will give contact info to [facility]. She is working on plan with her daughter in law to work together.   Continue to work with Danton Clap, LCSW  3.  Dyslipidemia:  labs

## 2020-02-21 LAB — CBC WITH DIFFERENTIAL/PLATELET
Basophils Absolute: 0 10*3/uL (ref 0.0–0.2)
Basos: 0 %
EOS (ABSOLUTE): 0.1 10*3/uL (ref 0.0–0.4)
Eos: 2 %
Hematocrit: 37.8 % (ref 34.0–46.6)
Hemoglobin: 12 g/dL (ref 11.1–15.9)
Immature Grans (Abs): 0 10*3/uL (ref 0.0–0.1)
Immature Granulocytes: 0 %
Lymphocytes Absolute: 2.2 10*3/uL (ref 0.7–3.1)
Lymphs: 23 %
MCH: 24.2 pg — ABNORMAL LOW (ref 26.6–33.0)
MCHC: 31.7 g/dL (ref 31.5–35.7)
MCV: 76 fL — ABNORMAL LOW (ref 79–97)
Monocytes Absolute: 0.5 10*3/uL (ref 0.1–0.9)
Monocytes: 5 %
Neutrophils Absolute: 6.7 10*3/uL (ref 1.4–7.0)
Neutrophils: 70 %
Platelets: 294 10*3/uL (ref 150–450)
RBC: 4.95 x10E6/uL (ref 3.77–5.28)
RDW: 14.4 % (ref 11.7–15.4)
WBC: 9.6 10*3/uL (ref 3.4–10.8)

## 2020-02-21 LAB — COMPREHENSIVE METABOLIC PANEL
ALT: 13 IU/L (ref 0–32)
AST: 17 IU/L (ref 0–40)
Albumin/Globulin Ratio: 1.2 (ref 1.2–2.2)
Albumin: 4.1 g/dL (ref 3.8–4.8)
Alkaline Phosphatase: 87 IU/L (ref 44–121)
BUN/Creatinine Ratio: 15 (ref 9–23)
BUN: 9 mg/dL (ref 6–24)
Bilirubin Total: 0.2 mg/dL (ref 0.0–1.2)
CO2: 25 mmol/L (ref 20–29)
Calcium: 8.9 mg/dL (ref 8.7–10.2)
Chloride: 103 mmol/L (ref 96–106)
Creatinine, Ser: 0.61 mg/dL (ref 0.57–1.00)
GFR calc Af Amer: 131 mL/min/{1.73_m2} (ref >59)
GFR calc non Af Amer: 114 mL/min/{1.73_m2} (ref >59)
Globulin, Total: 3.3 g/dL (ref 1.5–4.5)
Glucose: 75 mg/dL (ref 65–99)
Potassium: 4 mmol/L (ref 3.5–5.2)
Sodium: 140 mmol/L (ref 134–144)
Total Protein: 7.4 g/dL (ref 6.0–8.5)

## 2020-02-21 LAB — LIPID PANEL W/O CHOL/HDL RATIO
Cholesterol, Total: 190 mg/dL (ref 100–199)
HDL: 40 mg/dL (ref >39)
LDL Chol Calc (NIH): 126 mg/dL — ABNORMAL HIGH (ref 0–99)
Triglycerides: 136 mg/dL (ref 0–149)
VLDL Cholesterol Cal: 24 mg/dL (ref 5–40)

## 2020-03-11 ENCOUNTER — Telehealth: Payer: Self-pay | Admitting: Clinical

## 2020-03-18 ENCOUNTER — Telehealth (INDEPENDENT_AMBULATORY_CARE_PROVIDER_SITE_OTHER): Payer: Self-pay | Admitting: Clinical

## 2020-03-18 DIAGNOSIS — F331 Major depressive disorder, recurrent, moderate: Secondary | ICD-10-CM

## 2020-03-26 NOTE — Progress Notes (Addendum)
   THERAPY PROGRESS NOTE Via updox/phone  Session Time: 6pm-6:45pm Participation Level: Active Behavioral Response: CasualAlertEuthymic Type of Therapy: Individual Therapy Treatment Goals addressed: Coping skills to manage depressive and anxiety symptoms.    Purpose: LCSW met with client for routine individual therapy to work towards treatment goals:Coping skills to manage depressive and anxiety symptoms.   Intervention:  LCSW met with client for routine individual therapy to work towards treatment goals:Coping skills to manage depressive and anxiety symptoms. Session began via updox but due to connection was transitioned to phone. LCSW provided patient opportunity to check in to assess for any significant events and how she is doing today. LCSW utilized intervention of Strength-based as patient identified changes in mood due to reaching her short term goal. Discussed termination as patient identified stable mood at this time and her goal being met, one more session to check in how she is doing and discuss need to continue treatment. LCSW assessed for SI/HI/command psychosis.  Effectiveness: Patient is alert x4 affect. Patient identified she is feeling tired physically but mentally is feeling happy. Patient reports feeling happy because she has been able to go back to work at least 3 days of the week. Patient reports her son bought a car to help mom move around. Since then patient reports feeling independent and observed physically healthy. Patient reports she knew not working was playing a factor in her mental wellbeing because at home she was by isolated and had time to think. Now patient reports as she has began to work she has been able to send her mom money to take care of her health one less worry, and feeling productive. Patient reports she is still adjusting to going back to work physically but is grateful for it, as she is talking with her co workers who've shared how they missed her  too. Patient reports she still thinks about moving back to her home country but her mother advises her not to because of the risks and dangers. Patient reports her children also do not want to move back. Patient reports possibility of having a family discussion to discuss next steps.   Intervention was effective as patient was able to reflect and process her changes as she has noticed a change in mood and motivation. Patient was able to recognize her strengths and qualities such as hard worker, motivated and independent. LCSW praised patient on her motivation towards her goal. Progress towards goal is Ongoing. Patient denied active suicidal/homicidal/active psychosis.  Plan Patient offered next appointment for: 01/07 4pm   Diagnosis: MDD Moderate    Lujean Rave, LCSW 03/26/2020

## 2020-03-30 NOTE — Progress Notes (Signed)
   THERAPY PROGRESS NOTE Via updox 11/9 Session Time: 12:00pm-1:00pm Participation Level: Active Behavioral Response: CasualAlertSad Type of Therapy: Individual Therapy Treatment Goals addressed: [place] to manage depression and anxiety symptoms.   Purpose: LCSW met with client for routine individual therapy to work towards treatment goals: [place] to manage depression and anxiety symptoms.  Intervention: LCSW met with client for routine individual therapy to work towards treatment goals: [place] to manage depression and anxiety symptoms. LCSW provided patient opportunity to check in to assess how she is. LCSW utilized intervention of CBT to discuss motivation techniques to improve mood. LCSW provided reflective listening as patient processed her feelings. LCSW assessed for SI/HI/command psychosis.  Effectiveness: Patient is alert x4 affect. Patient identified reports she is feeling good. Reports she hopes to celebrate birthday at [facility]. 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 Patient reports continued doubts with her husband as he doesn't speak much to her regarding feelings and he keeps to himself. Patient reports she wants to go back to her home country as she feels no support from her immediate family, reports even her children have been rebellious lately. Patient reports she copes with all this by talking to her mom. Patient reports her mom encourages to stay here as it is risky back home.Progress towards goal is Ongoing. Patient denied active suicidal/homicidal/active psychosis.  Plan Patient offered next appointment for: 12/8 Diagnosis: Post Traumatic Stress Disorder     *late submission Lujean Rave, LCSW 03/30/2020

## 2020-04-17 ENCOUNTER — Other Ambulatory Visit: Payer: Self-pay

## 2020-04-17 ENCOUNTER — Ambulatory Visit: Payer: Self-pay | Admitting: Clinical

## 2020-04-17 DIAGNOSIS — F4310 Post-traumatic stress disorder, unspecified: Secondary | ICD-10-CM
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
## 2020-06-06 DIAGNOSIS — F439 Reaction to severe stress, unspecified: Secondary | ICD-10-CM | POA: Insufficient documentation

## 2020-06-06 DIAGNOSIS — Z6841 Body Mass Index (BMI) 40.0 and over, adult: Secondary | ICD-10-CM | POA: Insufficient documentation

## 2020-06-08 ENCOUNTER — Telehealth: Payer: Self-pay | Admitting: Internal Medicine

## 2020-06-08 NOTE — Telephone Encounter (Signed)
Patient called asking for an appointment. Patient stated that she is been having pain on left knee. Knee is also getting red and swollen for the last two weeks. Patient stated that she took one tablet of Diclofenac 50mg  that was sent to her from and it helped with the pain. Please advise.

## 2020-06-11 NOTE — Telephone Encounter (Signed)
Schedule acute when next available--also can wait list

## 2020-06-11 NOTE — Telephone Encounter (Signed)
Appointment for patient was schedule for 06/19/2020.

## 2020-06-19 ENCOUNTER — Encounter: Payer: Self-pay | Admitting: Internal Medicine

## 2020-06-19 ENCOUNTER — Ambulatory Visit: Payer: Self-pay | Admitting: Internal Medicine

## 2020-06-19 ENCOUNTER — Other Ambulatory Visit: Payer: Self-pay

## 2020-06-19 VITALS — BP 120/74 | HR 76 | Resp 12 | Ht 64.0 in | Wt 237.5 lb

## 2020-06-19 DIAGNOSIS — M25562 Pain in left knee: Secondary | ICD-10-CM

## 2020-06-19 DIAGNOSIS — M25561 Pain in right knee: Secondary | ICD-10-CM

## 2020-06-19 DIAGNOSIS — M7050 Other bursitis of knee, unspecified knee: Secondary | ICD-10-CM

## 2020-06-19 MED ORDER — DICLOFENAC SODIUM 1 % EX GEL: CUTANEOUS | 4 refills | Status: DC

## 2020-06-19 NOTE — Patient Instructions (Signed)
Knee compression sleeve

## 2020-06-19 NOTE — Progress Notes (Signed)
    Subjective:    Patient ID: Janet Cardenas, female   DOB: 07-20-1980, 40 y.o.   MRN: [id number]   HPI   Janet Cardenas interprets  Left knee pain:  Problem for about 1 month.  No definite other joints with similar problem.  No redness.  Some swelling.  The area itches however.  No fever.  Does feel achy at night.   Does not recall injuring the knee previously. Has had problems with the knee previously--about 1 year ago and pain went away on its own. She cannot recall if she had an injury or new increased physical activity last year when this occurred.    Working at [employer] and is moving all the time there.  Lot of side to side movement.  Has been with this job for 3 months.  She has taken Diclofenac for the pain--something she obtained from [place]    No outpatient medications have been marked as taking for the 06/19/20 encounter (Office Visit) with Julieanne Manson, MD.   No Known Allergies   Review of Systems    Objective:   BP 120/74 (BP Location: Left Arm, Patient Position: Sitting, Cuff Size: Large)   Pulse 76   Resp 12   Ht 5\' 4"  (1.626 m)   Wt 237 lb 8 oz (107.7 kg)   LMP 06/03/2020 (Approximate)   BMI 40.77 kg/m   Physical Exam NAD Morbidly obese Left knee without obvious erythema, increased warmth or effusion. Full ROM Tenderness over distal medial thigh soft tissue and extending onto medial proximal LE.  Very tender over pes anserine bursa area.   No definite joint line tenderness No laxity or pain with cruciate and collateral ligament stress maneuvers.   Assessment & Plan   Left Pes anserine bursitis.  4 inch ace wrap for now, but recommended knee compression sleeve May continue diclofenac twice daily with food. Weight loss:  discussed finding an exercise, such as swimming/stationary bike that does not exacerbate pain. Discussed improving diet at length quadricep strengthening exercises.Marland Kitchen

## 2020-11-02 ENCOUNTER — Other Ambulatory Visit: Payer: Self-pay

## 2020-11-02 ENCOUNTER — Telehealth: Payer: Self-pay | Admitting: Internal Medicine

## 2020-11-02 DIAGNOSIS — R34 Anuria and oliguria: Secondary | ICD-10-CM

## 2020-11-02 DIAGNOSIS — Z1152 Encounter for screening for COVID-19: Secondary | ICD-10-CM

## 2020-11-02 LAB — POCT URINALYSIS DIPSTICK
Bilirubin, UA: NEGATIVE
Glucose, UA: NEGATIVE
Ketones, UA: NEGATIVE
Leukocytes, UA: NEGATIVE
Nitrite, UA: NEGATIVE
Protein, UA: NEGATIVE
Spec Grav, UA: 1.02 (ref 1.010–1.025)
Urobilinogen, UA: 0.2 E.U./dL
pH, UA: 7 (ref 5.0–8.0)

## 2020-11-02 LAB — POC COVID19 BINAXNOW: SARS Coronavirus 2 Ag: NEGATIVE

## 2020-11-02 NOTE — Telephone Encounter (Signed)
Late documentation-   patient called this morning stating she has been having fever, sore throat left leg pain, swollen legs for 5 days. Pt. Has taken advil and it helped. No Covid test done yet.  Patient also shared she has not been able to urinate " normal" for 3 days. No other symptoms.  Discussed with Dr. Delrae Alfred.   Pt. Scheduled for a COVID test first and if negative to come in the clinic to drop a urine sample. (Pt.evaluate at clinic today at 2pm)

## 2020-11-05 LAB — URINE CULTURE

## 2020-11-17 MED ORDER — NITROFURANTOIN MONOHYD MACRO 100 MG PO CAPS: ORAL_CAPSULE | ORAL | 0 refills | Status: DC

## 2020-11-18 NOTE — Progress Notes (Signed)
Pt. Informed. States she will pick up medication today.  2 week appointment scheduled for 12/02/2020 at 3:30pm

## 2020-11-19 ENCOUNTER — Other Ambulatory Visit: Payer: Self-pay

## 2020-11-19 ENCOUNTER — Encounter: Payer: Self-pay | Admitting: Internal Medicine

## 2020-11-19 ENCOUNTER — Ambulatory Visit: Payer: Self-pay | Admitting: Internal Medicine

## 2020-11-19 VITALS — BP 110/70 | HR 62 | Resp 12 | Ht 64.0 in | Wt 243.0 lb

## 2020-11-19 DIAGNOSIS — N10 Acute tubulo-interstitial nephritis: Secondary | ICD-10-CM

## 2020-11-19 NOTE — Progress Notes (Signed)
    Subjective:    Patient ID: Janet Cardenas, female   DOB: 1980-05-03, 40 y.o.   MRN: [id number]   HPI   Enterococcus UTI:  patient stopped in on 7/25 with constellation of symptoms, including sore throat, left leg pain and swollen ankles.  Covid was negative.  UA was essentially normal, but sent for culture and grew enterococcus.  She is not having dysuria, urinary frequency.  She does feel the need to urinate, but not much volume results.  Rx for Macrobid to which enterococcus is sensitive sent to [facility], but she was unable to pick up yesterday.   2.  Having pain in left flank pain.  Maybe feels as if fever.  Taking unknown med for fever and feels better.  Has some nausea, but no vomiting.  No diarrhea.  She feels she still has a sore throat, but no LE edema at this time.  She is drinking lots of water.  No gross hematuria.  Patient states she has not been back to work for 15 days due to back pain, though this was not mentioned when she was in on the 25th.  Current Meds  Medication Sig   acetaminophen (TYLENOL) 500 MG tablet Take 500 mg by mouth every 6 (six) hours as needed.   No Known Allergies   Review of Systems    Objective:   BP 110/70 (BP Location: Left Arm, Patient Position: Sitting, Cuff Size: Large)   Pulse 62   Resp 12   Ht 5\' 4"  (1.626 m)   Wt 243 lb (110.2 kg)   BMI 41.71 kg/m   Physical Exam NAD Lungs:  CTA CV:  RRR without murmur or rub.  Radial pulses normal and equal Abd:  S, Mild tenderness in left flank area and around to anterior left abdomen.  No rebound or peritoneal signs. + BS, No HSM or mass LE:  No edema   Assessment & Plan   Likely Left pyelonephritis:  Macrobid sent in to [facility] and patient states she can go pick up today.  Will take one dose after obtaining and another this evening before bed.   Push fluids. Follow up in 1 week to be certain she is improving.

## 2020-11-26 ENCOUNTER — Encounter: Payer: Self-pay | Admitting: Internal Medicine

## 2020-11-26 ENCOUNTER — Other Ambulatory Visit: Payer: Self-pay

## 2020-11-26 ENCOUNTER — Telehealth: Payer: Self-pay | Admitting: Internal Medicine

## 2020-11-26 ENCOUNTER — Ambulatory Visit: Payer: Self-pay | Admitting: Internal Medicine

## 2020-11-26 VITALS — BP 118/80 | HR 72 | Resp 20 | Ht 64.0 in | Wt 243.0 lb

## 2020-11-26 DIAGNOSIS — M791 Myalgia, unspecified site: Secondary | ICD-10-CM

## 2020-11-26 DIAGNOSIS — N12 Tubulo-interstitial nephritis, not specified as acute or chronic: Secondary | ICD-10-CM

## 2020-11-26 DIAGNOSIS — J029 Acute pharyngitis, unspecified: Secondary | ICD-10-CM

## 2020-11-26 LAB — POCT URINALYSIS DIPSTICK
Bilirubin, UA: NEGATIVE
Blood, UA: NEGATIVE
Glucose, UA: NEGATIVE
Nitrite, UA: NEGATIVE
Protein, UA: NEGATIVE
Spec Grav, UA: 1.025 (ref 1.010–1.025)
Urobilinogen, UA: 0.2 E.U./dL
pH, UA: 6 (ref 5.0–8.0)

## 2020-11-26 LAB — POC COVID19 BINAXNOW: SARS Coronavirus 2 Ag: NEGATIVE

## 2020-11-26 MED ORDER — LORATADINE 10 MG PO TABS: 10.0000 mg | ORAL_TABLET | Freq: Every day | ORAL | 11 refills | Status: DC

## 2020-11-26 NOTE — Telephone Encounter (Signed)
Notified. 

## 2020-11-26 NOTE — Telephone Encounter (Signed)
Voicemail left to call back 

## 2020-11-26 NOTE — Progress Notes (Signed)
    Subjective:    Patient ID: Janet Cardenas, female   DOB: [birth year], 40 y.o.   MRN: [id number]   HPI  Here for follow up of likely pyelonephritis (left) with enterococcus.  She started the medication on 11/19/2020 when last seen, but for some reason is only taking once daily.   She does not feel any better.   Hurts in her bones.  Feels hot at times, but not clear she is having a fever.  Taking Advil for this, but does not seem to be helping any longer.  Has had the aching for 10 days.  Even her fingernails ache.   She does feel her left flank pain is only mild now. Always has a sore throat and that has not changed.  Often feels her throat is swollen. No cough.  No rhinorrhea.    Current Meds  Medication Sig   ibuprofen (ADVIL) 200 MG tablet Take 400 mg by mouth every 6 (six) hours as needed.   nitrofurantoin, macrocrystal-monohydrate, (MACROBID) 100 MG capsule 1 cap by mouth twice daily for 10 days.   No Known Allergies   Review of Systems    Objective:   BP 118/80 (BP Location: Left Arm, Patient Position: Sitting, Cuff Size: Normal)   Pulse 72   Resp 20   Ht 5\' 4"  (1.626 m)   Wt 243 lb (110.2 kg)   BMI 41.71 kg/m   Physical Exam NAD HEENT:   PERRL, EOMI, TMs pearly gray, posterior pharynx with mild cobbling.  No exudate. Nasal mucosa boggy with clear discharge. Neck:  Supple ,No adenopathy Chest:  CTA CV:  RRR without murmur or rub. Abd:  S, NT, No HSM, or mass.  No definite flank tenderness today + BS LE:  No edema.   Assessment & Plan    Pyelonephritis with enterococci, despite normal UA.  She is to change to twice daily dosing of macrobid and push fluids.  UA with trace Leuks today.  Return in 1 week for follow up after finished with 10 day course of Macrobid.  Check CBC, CMP  2.  Myalgias:  repeat COVID testing negative.  CPK, TSH, labs as in #1.  3.  Sore throat:  likely allergies:  Loratadine 10 mg daily

## 2020-11-27 LAB — COMPREHENSIVE METABOLIC PANEL
ALT: 13 IU/L (ref 0–32)
AST: 16 IU/L (ref 0–40)
Albumin/Globulin Ratio: 1.2 (ref 1.2–2.2)
Albumin: 3.7 g/dL — ABNORMAL LOW (ref 3.8–4.8)
Alkaline Phosphatase: 87 IU/L (ref 44–121)
BUN/Creatinine Ratio: 12 (ref 9–23)
BUN: 8 mg/dL (ref 6–24)
Bilirubin Total: 0.2 mg/dL (ref 0.0–1.2)
CO2: 22 mmol/L (ref 20–29)
Calcium: 9 mg/dL (ref 8.7–10.2)
Chloride: 105 mmol/L (ref 96–106)
Creatinine, Ser: 0.65 mg/dL (ref 0.57–1.00)
Globulin, Total: 3 g/dL (ref 1.5–4.5)
Glucose: 108 mg/dL — ABNORMAL HIGH (ref 65–99)
Potassium: 3.5 mmol/L (ref 3.5–5.2)
Sodium: 142 mmol/L (ref 134–144)
Total Protein: 6.7 g/dL (ref 6.0–8.5)
eGFR: 114 mL/min/{1.73_m2} (ref >59)

## 2020-11-27 LAB — CBC WITH DIFFERENTIAL/PLATELET
Basophils Absolute: 0 10*3/uL (ref 0.0–0.2)
Basos: 1 %
EOS (ABSOLUTE): 0.2 10*3/uL (ref 0.0–0.4)
Eos: 2 %
Hematocrit: 35.1 % (ref 34.0–46.6)
Hemoglobin: 11.5 g/dL (ref 11.1–15.9)
Immature Grans (Abs): 0 10*3/uL (ref 0.0–0.1)
Immature Granulocytes: 0 %
Lymphocytes Absolute: 2.2 10*3/uL (ref 0.7–3.1)
Lymphs: 26 %
MCH: 25.1 pg — ABNORMAL LOW (ref 26.6–33.0)
MCHC: 32.8 g/dL (ref 31.5–35.7)
MCV: 77 fL — ABNORMAL LOW (ref 79–97)
Monocytes Absolute: 0.5 10*3/uL (ref 0.1–0.9)
Monocytes: 5 %
Neutrophils Absolute: 5.5 10*3/uL (ref 1.4–7.0)
Neutrophils: 66 %
Platelets: 280 10*3/uL (ref 150–450)
RBC: 4.59 x10E6/uL (ref 3.77–5.28)
RDW: 14 % (ref 11.7–15.4)
WBC: 8.4 10*3/uL (ref 3.4–10.8)

## 2020-11-27 LAB — TSH: TSH: 2.29 u[IU]/mL (ref 0.450–4.500)

## 2020-11-27 LAB — CK: Total CK: 63 U/L (ref 32–182)

## 2020-12-02 ENCOUNTER — Other Ambulatory Visit: Payer: Self-pay

## 2020-12-03 ENCOUNTER — Other Ambulatory Visit: Payer: Self-pay

## 2020-12-03 ENCOUNTER — Encounter: Payer: Self-pay | Admitting: Internal Medicine

## 2020-12-03 ENCOUNTER — Ambulatory Visit: Payer: Self-pay | Admitting: Internal Medicine

## 2020-12-03 VITALS — BP 136/84 | HR 76 | Resp 20 | Ht 64.0 in | Wt 247.0 lb

## 2020-12-03 DIAGNOSIS — N12 Tubulo-interstitial nephritis, not specified as acute or chronic: Secondary | ICD-10-CM

## 2020-12-03 DIAGNOSIS — M791 Myalgia, unspecified site: Secondary | ICD-10-CM

## 2020-12-03 LAB — POCT URINALYSIS DIPSTICK
Bilirubin, UA: NEGATIVE
Blood, UA: NEGATIVE
Glucose, UA: NEGATIVE
Ketones, UA: NEGATIVE
Nitrite, UA: NEGATIVE
Protein, UA: NEGATIVE
Spec Grav, UA: 1.02 (ref 1.010–1.025)
Urobilinogen, UA: 0.2 E.U./dL
pH, UA: 8 (ref 5.0–8.0)

## 2020-12-03 NOTE — Patient Instructions (Signed)
Come much agua

## 2020-12-03 NOTE — Progress Notes (Signed)
    Subjective:    Patient ID: Janet Cardenas, female   DOB: 1980-12-02, 40 y.o.   MRN: [id number]   HPI  [name] interpreting.  Here for follow up of left pyelonephritis.  Finished 10 day course of Nitrofurantoin on Tuesday, 12/01/20-previously was only taking once daily as did not understand the directions for use.  She switched to twice daily after follow up on 8/18 when this was recognized.  Still with some left flank discomfort.   She states she drinks a lot of water, but when urinates, only results in small amount of urine.  Does not have urinary frequency.  No dysuria.  Describes a lot of pain in legs and back after sitting for long period of time--harder to stand up.   After standing for prolonged period, gets swelling in legs.   Still with some light headedness and fatigue.   CBC with last visit was normal.     Current Meds  Medication Sig   ibuprofen (ADVIL) 200 MG tablet Take 400 mg by mouth every 6 (six) hours as needed.   loratadine (CLARITIN) 10 MG tablet Take 1 tablet (10 mg total) by mouth daily.   No Known Allergies   Review of Systems    Objective:   BP 136/84 (BP Location: Right Arm, Patient Position: Sitting, Cuff Size: Normal)   Pulse 76   Resp 20   Ht 5\' 4"  (1.626 m)   Wt 247 lb (112 kg)   BMI 42.40 kg/m   Physical Exam NAD HEENT:  throat without injection Neck:  Supple, No adenopathy Chest:  CTA CV:  RRR without murmur or rub.  Radial and DP pulses normal and equal Abd:  S, NT, No HSM or mass, + BS, No definite flank tenderness. LE:  No edema.   Assessment & Plan   Left pyelonephritis and myalgias:  UA and repeat urine culture.  Not getting clearly whether she is improved or not.  Push fluids.   TSH, CK, CBC, CMP all okay at last visit.

## 2020-12-05 LAB — URINE CULTURE

## 2021-02-14 DIAGNOSIS — E785 Hyperlipidemia, unspecified: Secondary | ICD-10-CM | POA: Insufficient documentation

## 2021-02-18 ENCOUNTER — Encounter: Payer: Self-pay | Admitting: Internal Medicine

## 2021-02-18 ENCOUNTER — Other Ambulatory Visit: Payer: Self-pay

## 2021-02-18 ENCOUNTER — Ambulatory Visit: Payer: Self-pay | Admitting: Internal Medicine

## 2021-02-18 VITALS — BP 122/88 | HR 64 | Resp 16 | Ht 64.5 in | Wt 242.0 lb

## 2021-02-18 DIAGNOSIS — Z23 Encounter for immunization: Secondary | ICD-10-CM

## 2021-02-18 DIAGNOSIS — R739 Hyperglycemia, unspecified: Secondary | ICD-10-CM

## 2021-02-18 DIAGNOSIS — R059 Cough, unspecified: Secondary | ICD-10-CM

## 2021-02-18 DIAGNOSIS — Z0000 Encounter for general adult medical examination without abnormal findings: Secondary | ICD-10-CM

## 2021-02-18 DIAGNOSIS — Z6841 Body Mass Index (BMI) 40.0 and over, adult: Secondary | ICD-10-CM

## 2021-02-18 DIAGNOSIS — M79651 Pain in right thigh: Secondary | ICD-10-CM

## 2021-02-18 DIAGNOSIS — M79652 Pain in left thigh: Secondary | ICD-10-CM

## 2021-02-18 DIAGNOSIS — Z1231 Encounter for screening mammogram for malignant neoplasm of breast: Secondary | ICD-10-CM

## 2021-02-18 LAB — POC COVID19 BINAXNOW: SARS Coronavirus 2 Ag: NEGATIVE

## 2021-02-18 NOTE — Progress Notes (Signed)
Subjective:    Patient ID: Janet Cardenas, female   DOB: 1981/02/17, 40 y.o.   MRN: [id number]   HPI  Interpreted by Tildon Husky  CPE without pap  1.  Pap:  Last in 11/2018 and normal.    2.  Mammogram:  Never.  Did not get set up with order last year.  First cousin with history of breast cancer diagnosed at age 16.    3.  Osteoprevention:  Drinks 1 cup milk daily.  Whole milk.  No physical activity outside of work.  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  4.  Guaiac Cards/FIT:  Never.  5.  Colonoscopy:  Never.  No known family history of colon cancer.    6.  Immunizations:  Refusing COVID booster today--afraid she will get sick  She will be okay with influenza vaccine.   Immunization History  Administered Date(s) Administered   Influenza Inj Mdck Quad Pf 02/18/2021   Influenza-Unspecified 12/30/2019   PFIZER(Purple Top)SARS-COV-2 Vaccination 08/29/2019, 09/23/2019   Tdap 03/05/2018     7.  Glucose/Cholesterol:  Recent glucoses a bit high at 108.  She has high normal cholesterol with okay LDL in past    Current Meds  Medication Sig   ibuprofen (ADVIL) 200 MG tablet Take 400 mg by mouth every 6 (six) hours as needed.   loratadine (CLARITIN) 10 MG tablet Take 1 tablet (10 mg total) by mouth daily.   No Known Allergies  Past Medical History:  Diagnosis Date   Depression    Gestational (pregnancy-induced) hypertension without significant proteinuria, complicating childbirth    Obesity (BMI 35.0-39.9 without comorbidity)    RBC microcytosis 04/01/2019   Past Surgical History:  Procedure Laterality Date   CESAREAN SECTION  2009   TUBAL LIGATION  2009   With C/S     Family History  Problem Relation Age of Onset   Hypertension Mother    Osteoporosis Mother    Kidney disease Father        Reportedly due to side effects of meds for pulmonary disease   COPD Father    Alcohol abuse Brother    Hypertension Brother     Social History    Socioeconomic History   Marital status: Married    Spouse name: Arvin Collard   Number of children: 3   Years of education: 6   Highest education level: Not on file  Occupational History   Occupation: Housewife  Tobacco Use   Smoking status: Never   Smokeless tobacco: Never  Vaping Use   Vaping Use: Never used  Substance and Sexual Activity   Alcohol use: Never   Drug use: Never   Sexual activity: Yes    Birth control/protection: Surgical  Other Topics Concern   Not on file  Social History Narrative   Lives at home with husband and children.   Older sons female partner.   Social Determinants of Health   Financial Resource Strain: Low Risk    Difficulty of Paying Living Expenses: Not hard at all  Food Insecurity: No Food Insecurity   Worried About Programme researcher, broadcasting/film/video in the Last Year: Never true   Ran Out of Food in the Last Year: Never true  Transportation Needs: No Transportation Needs   Lack of Transportation (Medical): No   Lack of Transportation (Non-Medical): No  Physical Activity: Not on file  Stress: Not on file  Social Connections: Not on file  Intimate Partner Violence:  Not At Risk   Fear of Current or Ex-Partner: No   Emotionally Abused: No   Physically Abused: No   Sexually Abused: No      Review of Systems  Respiratory:  Negative for shortness of breath.   Cardiovascular:  Negative for chest pain.  Musculoskeletal:        Pain from thigh down after sitting for a while and takes her a bit to be able to walk without difficulty.       Objective:   BP 122/88 (BP Location: Right Arm, Patient Position: Sitting, Cuff Size: Normal)   Pulse 64   Resp 16   Ht 5' 4.5" (1.638 m)   Wt 242 lb (109.8 kg)   LMP 02/08/2021 (Within Days)   BMI 40.90 kg/m   Physical Exam Constitutional:      Appearance: She is obese.  HENT:     Head: Normocephalic and atraumatic.     Right Ear: Tympanic membrane, ear canal and external ear normal.     Left Ear:  Tympanic membrane, ear canal and external ear normal.     Nose: Nose normal.     Mouth/Throat:     Mouth: Mucous membranes are moist.     Pharynx: Oropharynx is clear.  Eyes:     Extraocular Movements: Extraocular movements intact.     Conjunctiva/sclera: Conjunctivae normal.     Pupils: Pupils are equal, round, and reactive to light.     Funduscopic exam:    Right eye: Red reflex present.        Left eye: Red reflex present.    Comments: Discs sharp bilaterally.  Neck:     Thyroid: No thyroid mass or thyromegaly.  Cardiovascular:     Rate and Rhythm: Normal rate and regular rhythm.     Heart sounds: S1 normal and S2 normal. No murmur heard.   No friction rub. No S3 or S4 sounds.     Comments: No carotid bruits.  Carotid, radial, femoral, DP and PT pulses normal and equal.    Pulmonary:     Effort: Pulmonary effort is normal.     Breath sounds: Normal breath sounds.  Chest:  Breasts:    Right: No inverted nipple, mass or nipple discharge.     Left: No inverted nipple, mass or nipple discharge.  Abdominal:     General: Bowel sounds are normal.     Palpations: Abdomen is soft. There is no hepatomegaly, splenomegaly or mass.     Tenderness: There is no abdominal tenderness.     Hernia: No hernia is present.  Genitourinary:    Comments: Normal external female genitalia. No uterine or adnexal area mass or tenderness. Musculoskeletal:        General: Normal range of motion.     Cervical back: Normal range of motion and neck supple.     Right lower leg: No edema.     Left lower leg: No edema.     Comments: Feels stretch of thigh area where pain with external rotation and abduction of hip and flexion of knee bilaterally  Lymphadenopathy:     Head:     Right side of head: No submental or submandibular adenopathy.     Left side of head: No submental or submandibular adenopathy.     Cervical: No cervical adenopathy.     Upper Body:     Right upper body: No supraclavicular or  axillary adenopathy.     Left upper body: No supraclavicular or axillary  adenopathy.     Lower Body: No right inguinal adenopathy. No left inguinal adenopathy.  Skin:    General: Skin is warm.     Capillary Refill: Capillary refill takes less than 2 seconds.     Findings: No rash.  Neurological:     General: No focal deficit present.     Mental Status: She is alert and oriented to person, place, and time.     Cranial Nerves: Cranial nerves 2-12 are intact.     Sensory: Sensation is intact.     Motor: Motor function is intact.     Coordination: Coordination is intact.     Gait: Gait is intact.     Deep Tendon Reflexes: Reflexes are normal and symmetric.  Psychiatric:        Attention and Perception: Attention normal.        Mood and Affect: Mood normal.        Speech: Speech normal.        Behavior: Behavior normal. Behavior is cooperative.    POCT COVID negative--performed due to recent cough that is improving. Assessment & Plan    CPE without pap Mammogram ordered--encouraged no screening and to listen to voicemails. FIT testing to return in 2 weeks. Influenza vaccine. FLP  2.  Obesity and hyperglycemia:  A1C.  Encouraged making goals for dietary changes and physical activity/weight loss.  Especially now she lives in town--has more options to be active.    3.  Greater trochanteric bursitis:  stretches twice daily.

## 2021-02-19 LAB — LIPID PANEL W/O CHOL/HDL RATIO
Cholesterol, Total: 173 mg/dL (ref 100–199)
HDL: 35 mg/dL — ABNORMAL LOW (ref >39)
LDL Chol Calc (NIH): 111 mg/dL — ABNORMAL HIGH (ref 0–99)
Triglycerides: 150 mg/dL — ABNORMAL HIGH (ref 0–149)
VLDL Cholesterol Cal: 27 mg/dL (ref 5–40)

## 2021-02-19 LAB — HGB A1C W/O EAG: Hgb A1c MFr Bld: 5.4 % (ref 4.8–5.6)

## 2021-05-18 ENCOUNTER — Encounter (INDEPENDENT_AMBULATORY_CARE_PROVIDER_SITE_OTHER): Payer: Self-pay

## 2021-05-18 ENCOUNTER — Other Ambulatory Visit: Payer: Self-pay

## 2021-05-18 ENCOUNTER — Ambulatory Visit
Admission: RE | Admit: 2021-05-18 | Discharge: 2021-05-18 | Disposition: A | Discharge status: Home / Self Care | Payer: No Typology Code available for payment source | Source: Ambulatory Visit | Attending: Internal Medicine | Admitting: Internal Medicine

## 2021-05-18 ENCOUNTER — Ambulatory Visit: Payer: Self-pay | Admitting: *Deleted

## 2021-05-18 VITALS — BP 108/74 | Wt 244.7 lb

## 2021-05-18 DIAGNOSIS — Z1239 Encounter for other screening for malignant neoplasm of breast: Secondary | ICD-10-CM

## 2021-05-18 DIAGNOSIS — Z1231 Encounter for screening mammogram for malignant neoplasm of breast: Secondary | ICD-10-CM

## 2021-05-18 NOTE — Patient Instructions (Signed)
Explained breast self awareness with Rebecca Eaton. Patient did not need a Pap smear today due to last Pap smear was 11/29/2018. Let her know [facility] will cover Pap smears every 3 years unless has a history of abnormal Pap smears. Referred patient to the [facility] of [facility] for a screening mammogram on mobile unit. Appointment scheduled Tuesday, May 18, 2021 at 1130. Patient aware of appointment and will be there. Let patient know the Breast Center will follow up with her within the next couple weeks with results of her mammogram by letter or phone. Viola Sharyn Blitz Sosa verbalized understanding.  Ciarrah Rae, Kathaleen Maser, RN 10:59 AM

## 2021-05-18 NOTE — Progress Notes (Signed)
[name] is a 41 y.o. female who presents to [facility] clinic today with no complaints.    Pap Smear: Pap smear not completed today. Last Pap smear was 11/29/2018 at [facility] clinic and was normal. Per patient has no history of an abnormal Pap smear. Last Pap smear result is available in Epic.   Physical exam: Breasts Right breast larger than left breast that per patient is normal for her. No skin abnormalities bilateral breasts. No nipple retraction bilateral breasts. No nipple discharge bilateral breasts. No lymphadenopathy. No lumps palpated bilateral breasts. No complaints of pain or tenderness on exam.   Pelvic/Bimanual Pap is not indicated today per [facility] guidelines.    Smoking History: Patient has never smoked.   Patient Navigation: Patient education provided. Access to services provided for patient through [facility] program. Spanish interpreter Janet Cardenas from [facility] provided.    Breast and Cervical Cancer Risk Assessment: Patient has family history of her maternal first cousin having breast cancer. Patient has no known genetic mutations or history of radiation treatment to the chest before age 20. Patient does not have history of cervical dysplasia, immunocompromised, or DES exposure in-utero.  Risk Assessment     Risk Scores       05/18/2021   Last edited by: Janet Cardenas, CMA   5-year risk: 0.3 %   Lifetime risk: 4.7 %            A: [facility] exam without pap smear No complaints.  P: Referred patient to the [facility] of [facility] for a screening mammogram on mobile unit. Appointment scheduled Tuesday, May 18, 2021 at 1130.  Janet Heidelberg, RN 05/18/2021 10:59 AM

## 2021-05-20 ENCOUNTER — Other Ambulatory Visit: Payer: Self-pay | Admitting: Internal Medicine

## 2021-05-20 DIAGNOSIS — R928 Other abnormal and inconclusive findings on diagnostic imaging of breast: Secondary | ICD-10-CM

## 2021-05-27 ENCOUNTER — Other Ambulatory Visit: Payer: Self-pay | Admitting: Obstetrics and Gynecology

## 2021-05-27 DIAGNOSIS — R928 Other abnormal and inconclusive findings on diagnostic imaging of breast: Secondary | ICD-10-CM

## 2021-06-28 ENCOUNTER — Ambulatory Visit
Admission: RE | Admit: 2021-06-28 | Discharge: 2021-06-28 | Disposition: A | Discharge status: Home / Self Care | Payer: No Typology Code available for payment source | Source: Ambulatory Visit | Attending: Obstetrics and Gynecology | Admitting: Obstetrics and Gynecology

## 2021-06-28 ENCOUNTER — Other Ambulatory Visit: Payer: Self-pay | Admitting: Obstetrics and Gynecology

## 2021-06-28 DIAGNOSIS — R928 Other abnormal and inconclusive findings on diagnostic imaging of breast: Secondary | ICD-10-CM

## 2021-09-29 ENCOUNTER — Ambulatory Visit: Payer: Self-pay | Admitting: Internal Medicine

## 2021-09-29 ENCOUNTER — Encounter: Payer: Self-pay | Admitting: Internal Medicine

## 2021-09-29 VITALS — BP 122/68 | HR 76 | Resp 16 | Ht 64.5 in | Wt 240.0 lb

## 2021-09-29 DIAGNOSIS — G8929 Other chronic pain: Secondary | ICD-10-CM

## 2021-09-29 DIAGNOSIS — M542 Cervicalgia: Secondary | ICD-10-CM

## 2021-09-29 DIAGNOSIS — M25562 Pain in left knee: Secondary | ICD-10-CM

## 2021-09-29 DIAGNOSIS — M25561 Pain in right knee: Secondary | ICD-10-CM

## 2021-09-29 DIAGNOSIS — M546 Pain in thoracic spine: Secondary | ICD-10-CM

## 2021-09-29 MED ORDER — ACETAMINOPHEN 500 MG PO TABS: ORAL_TABLET | ORAL | 0 refills | Status: DC

## 2021-09-29 NOTE — Patient Instructions (Addendum)
[facility] [address], [address] [place], [place]  [address]  [phone]   Tome un vaso de agua antes de cada comida Tome un minimo de 6 a 8 vasos de agua diarios Coma tres veces al dia Coma Neomia Dear proteina y Neomia Dear grasa saludable con comida.  (huevos, pescado, pollo, pavo, y limite carnes rojas Coma 5 porciones diarias de legumbres.  Mezcle los colores Coma 2 porciones diarias de frutas con cascara cuando sea comestible Use platos pequeos Suelte su tenedor o cuchara despues de cada mordida hata que se mastique y se trague Come en la mesa con amigos o familiares por lo menos una vez al dia [place] la televisin y aparatos electrnicos durante la comida  Su objetivo debe ser perder una libra por semana  Estudios recientes indican que las personas quienes consumen todos de sus calorias durante 12 horas se bajan de pesocon Mas eficiencia.  Por ejemplo, si Usted come su primera comida a las 7:00 a.m., su comida final del dia se debe completar antes de las 7:00 p.m.

## 2021-09-29 NOTE — Progress Notes (Signed)
    Subjective:    Patient ID: Janet Cardenas, female   DOB: [birth year], 41 y.o.   MRN: [id number]   HPI  Tildon Husky interprets   Bilateral knee pain:  Has had problems with pain for 2-3 months.  Denies injury. Initially intermittent, but now more constant.  Seems to bother her no matter her activity during the day or position she is in.  Feels the pain is as bad on her days off as the day she works.   She was seen at [facility] 07/14/2021 and xrays ordered at [place].  She has been unable to connect with [facility] as no Spanish speakers to make her appt.  No labs were obtained at this visit. She was prescribed Diclofenac 75 mg twice daily.  She took the medication for almost 1 month, which helped, but pain over time did not dissipate.   No redness of knee joints, but does feel she has swelling.  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  Wears Crocs for work.   She does not have an orange card as cannot get a letter from her employer or her husband's employer.   Applies rubbing alcohol mixed with MJ oil on her knees as well.  States not CBD oil.  States has helped.  Has only used for past 2 days.    2.  Left neck pain for 8 days.  Points to SCM muscle on left.  Denies doing anything recently where she may have strained the muscle.  She is concerned about her thyroid.   No outpatient medications have been marked as taking for the 09/29/21 encounter (Office Visit) with Julieanne Manson, MD.   No Known Allergies   Review of Systems    Objective:   BP 122/68 (BP Location: Left Arm, Patient Position: Sitting, Cuff Size: Normal)   Pulse 76   Resp 16   Ht 5' 4.5" (1.638 m)   Wt 240 lb (108.9 kg)   LMP 09/27/2021 (Exact Date)   BMI 40.56 kg/m   Physical Exam Left SCM muscle with tenderness, also left trap and left cervical paraspinous musculature tender to nuchal ridge.  NT over right musculature of same areas. NT over thyroid and without abnormality.  Knees:  obese  legs Full ROM. No erythema or obvious effusion.  No swelling overlying patella.  Tender all over medial joint bilaterally, not clear if tender specifically over medial joint line.   No laxity or pain on stress of cruciate and collateral ligaments.     Assessment & Plan   Bilateral chronic knee pain:  Tylenol 1000 mg twice daily.  Send for Xrays of knees.  Referral to PT at [facility].  2.  Weigh loss discussed again.  3.  Left SCM strain:  stretches given.  Referral to PT  4.  Thoracic muscle pain:  referral to PT.    5.  Social:  needs help with applying for oange card.  Referral to [facility], [facility].

## 2021-09-30 LAB — ANA: Anti Nuclear Antibody (ANA): NEGATIVE

## 2021-09-30 LAB — RHEUMATOID FACTOR: Rheumatoid fact SerPl-aCnc: <10 IU/mL (ref <14.0)

## 2021-09-30 LAB — SEDIMENTATION RATE: Sed Rate: 37 mm/hr — ABNORMAL HIGH (ref 0–32)

## 2021-10-05 ENCOUNTER — Telehealth: Payer: Self-pay

## 2021-10-05 NOTE — Telephone Encounter (Signed)
Patient called asking an appointment after noticing bump on vaginal area. Has had it for 5 days. Unable to get a good look to give description. Has used penicillin cream, but it has not helped. Has constant pinching pain.

## 2021-10-06 NOTE — Telephone Encounter (Signed)
Acute appt.

## 2021-10-06 NOTE — Telephone Encounter (Signed)
Patient has been scheduled for an acute

## 2021-10-07 ENCOUNTER — Encounter: Payer: Self-pay | Admitting: Internal Medicine

## 2021-10-07 ENCOUNTER — Ambulatory Visit: Payer: Self-pay | Admitting: Internal Medicine

## 2021-10-07 VITALS — BP 120/72 | HR 76 | Resp 20 | Ht 64.5 in | Wt 240.0 lb

## 2021-10-07 DIAGNOSIS — N750 Cyst of Bartholin's gland: Secondary | ICD-10-CM

## 2021-10-07 MED ORDER — SULFAMETHOXAZOLE-TRIMETHOPRIM 800-160 MG PO TABS: ORAL_TABLET | ORAL | 0 refills | Status: DC

## 2021-10-07 NOTE — Progress Notes (Signed)
    Subjective:    Patient ID: Janet Cardenas, female   DOB: 04-Dec-1980, 41 y.o.   MRN: [id number]   HPI Janet Cardenas and [name] interpreting.  Lesion in genital area--describes on right labial area.  Has noted for 15 days--now slightly larger than when first noted.  No drainage.  States it is tender to touch.    No outpatient medications have been marked as taking for the 10/07/21 encounter (Office Visit) with Julieanne Manson, MD.   No Known Allergies   Review of Systems    Objective:   BP 120/72 (BP Location: Left Arm, Patient Position: Sitting, Cuff Size: Normal)   Pulse 76   Resp 20   Ht 5' 4.5" (1.638 m)   Wt 240 lb (108.9 kg)   LMP 09/27/2021 (Exact Date)   BMI 40.56 kg/m   Physical Exam Right vulva with 1 cm cystic lesion --posterior labia.  Tender and mildly erythematous.  Small amouth of pus expressed.  Area cleaned and 11 blade used to make small incision with resulting pustular and bloody discharge with decrease in size.   Discussed to call if increased pain, swelling, fever.  Assessment & Plan    Right infected Bartholin Gland Cyst:  discussed warm water sitz baths for 20 minutes twice daily to continue to drain.   Bactrim DS 1 tab by mouth twice daily for 7 days.

## 2021-12-30 ENCOUNTER — Other Ambulatory Visit: Payer: No Typology Code available for payment source

## 2021-12-31 ENCOUNTER — Ambulatory Visit: Payer: Self-pay | Admitting: Internal Medicine

## 2022-01-19 ENCOUNTER — Ambulatory Visit
Admission: RE | Admit: 2022-01-19 | Discharge: 2022-01-19 | Disposition: A | Discharge status: Home / Self Care | Payer: No Typology Code available for payment source | Source: Ambulatory Visit | Attending: Obstetrics and Gynecology | Admitting: Obstetrics and Gynecology

## 2022-01-19 ENCOUNTER — Other Ambulatory Visit: Payer: Self-pay | Admitting: Obstetrics and Gynecology

## 2022-01-19 DIAGNOSIS — N6320 Unspecified lump in the left breast, unspecified quadrant: Secondary | ICD-10-CM

## 2022-01-19 DIAGNOSIS — R928 Other abnormal and inconclusive findings on diagnostic imaging of breast: Secondary | ICD-10-CM

## 2022-02-01 ENCOUNTER — Ambulatory Visit
Admission: RE | Admit: 2022-02-01 | Discharge: 2022-02-01 | Disposition: A | Discharge status: Home / Self Care | Payer: No Typology Code available for payment source | Source: Ambulatory Visit | Attending: Obstetrics and Gynecology | Admitting: Obstetrics and Gynecology

## 2022-02-01 DIAGNOSIS — N6320 Unspecified lump in the left breast, unspecified quadrant: Secondary | ICD-10-CM

## 2022-02-24 ENCOUNTER — Encounter: Payer: Self-pay | Admitting: Internal Medicine

## 2022-04-13 ENCOUNTER — Encounter: Payer: Self-pay | Admitting: Internal Medicine

## 2022-04-13 ENCOUNTER — Other Ambulatory Visit: Payer: Self-pay | Admitting: Internal Medicine

## 2022-04-13 ENCOUNTER — Ambulatory Visit: Payer: Self-pay | Admitting: Internal Medicine

## 2022-04-13 VITALS — BP 120/80 | HR 76 | Resp 16 | Ht 64.5 in | Wt 245.0 lb

## 2022-04-13 DIAGNOSIS — R059 Cough, unspecified: Secondary | ICD-10-CM

## 2022-04-13 DIAGNOSIS — N72 Inflammatory disease of cervix uteri: Secondary | ICD-10-CM

## 2022-04-13 DIAGNOSIS — Z124 Encounter for screening for malignant neoplasm of cervix: Secondary | ICD-10-CM

## 2022-04-13 DIAGNOSIS — E785 Hyperlipidemia, unspecified: Secondary | ICD-10-CM

## 2022-04-13 DIAGNOSIS — F339 Major depressive disorder, recurrent, unspecified: Secondary | ICD-10-CM

## 2022-04-13 DIAGNOSIS — Z1159 Encounter for screening for other viral diseases: Secondary | ICD-10-CM

## 2022-04-13 DIAGNOSIS — F410 Panic disorder [episodic paroxysmal anxiety] without agoraphobia: Secondary | ICD-10-CM

## 2022-04-13 DIAGNOSIS — Z0000 Encounter for general adult medical examination without abnormal findings: Secondary | ICD-10-CM

## 2022-04-13 DIAGNOSIS — F4310 Post-traumatic stress disorder, unspecified: Secondary | ICD-10-CM

## 2022-04-13 DIAGNOSIS — R739 Hyperglycemia, unspecified: Secondary | ICD-10-CM

## 2022-04-13 DIAGNOSIS — F419 Anxiety disorder, unspecified: Secondary | ICD-10-CM

## 2022-04-13 DIAGNOSIS — Z114 Encounter for screening for human immunodeficiency virus [HIV]: Secondary | ICD-10-CM

## 2022-04-13 LAB — POCT WET PREP WITH KOH
Clue Cells Wet Prep HPF POC: NEGATIVE
KOH Prep POC: NEGATIVE
RBC Wet Prep HPF POC: NEGATIVE
Yeast Wet Prep HPF POC: NEGATIVE

## 2022-04-13 LAB — POCT INFLUENZA A/B
Influenza A, POC: NEGATIVE
Influenza B, POC: NEGATIVE

## 2022-04-13 LAB — POC COVID19 BINAXNOW: SARS Coronavirus 2 Ag: NEGATIVE

## 2022-04-13 NOTE — Addendum Note (Signed)
Addended by: Marcelino Duster on: 04/13/2022 01:34 PM   Modules accepted: Orders

## 2022-04-13 NOTE — Progress Notes (Signed)
Subjective:    Patient ID: Janet Cardenas, female   DOB: 1981-01-28, 42 y.o.   MRN: [id number]   HPI  CPE with pap  1.  Pap:  Last performed 11/2018 with benign reparative changes.  2.  Mammogram:  Left Breast mass on mammogram in February.  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  Not clear when should have next mammogram--Feb or October.  Distant family history of breast cancer in a 2nd cousin.    3.  Osteoprevention: Not much in way of milk daily--only in coffee.  Does not eat cheese or yogurt.  Does not like any form of milk, even almond.   She is walking on a treadmill every day for 16-20 minutes.  4.  Guaiac Cards/FIT:  Has never returned in past.    5.  Colonoscopy:  Never.  No family history of colon cancer.    6.  Immunizations:  Did not get influenza vaccine and does not want COVID as she did not feel well with COVID vaccines:  fever, weak, could not stand up from bed.   Immunization History  Administered Date(s) Administered   Influenza Inj Mdck Quad Pf 02/18/2021   Influenza-Unspecified 12/30/2019   PFIZER(Purple Top)SARS-COV-2 Vaccination 08/29/2019, 09/23/2019   Tdap 03/05/2018     7.  Glucose/Cholesterol:  History of hyperglycemia with normal range A1C at 5.4%.  Does have dyslipidemia with high normal total cholesterol.   Lipid Panel     Component Value Date/Time   CHOL 173 02/18/2021 1253   TRIG 150 (H) 02/18/2021 1253   HDL 35 (L) 02/18/2021 1253   LDLCALC 111 (H) 02/18/2021 1253   LABVLDL 27 02/18/2021 1253     No outpatient medications have been marked as taking for the 04/13/22 encounter (Office Visit) with Mack Hook, MD.   No Known Allergies   Review of Systems  Constitutional:  Negative for fever.  HENT:  Positive for postnasal drip (with dryness) and sore throat (More of an itch). Negative for ear pain. Congestion: started yesterday..  Eyes:  Negative for discharge and itching.  Respiratory:  Positive  for cough. Negative for shortness of breath.   Cardiovascular:  Positive for palpitations (Only with episodes of anxiety--tachy.). Negative for chest pain.  Psychiatric/Behavioral:  Positive for dysphoric mood. Negative for suicidal ideas. The patient is nervous/anxious (Was making tamales at Christmas and just got short of breath and trembling.  She went to bed and when awakened, was fine again.  Has a sense of fear frequently.  Has weekly and lasts until able to sleep and then goes away.  difficult history.  Worsening.).    Past Medical History:  Diagnosis Date   Depression    Gestational (pregnancy-induced) hypertension without significant proteinuria, complicating childbirth    Obesity (BMI 35.0-39.9 without comorbidity)    RBC microcytosis 04/01/2019   Past Surgical History:  Procedure Laterality Date   CESAREAN SECTION  2009   TUBAL LIGATION  2009   With C/S    Family History  Problem Relation Age of Onset   Hypertension Mother    Osteoporosis Mother    Kidney disease Father        Reportedly due to side effects of meds for pulmonary disease   COPD Father    Alcohol abuse Brother    Hypertension Brother    Breast cancer Cousin    Social History   Socioeconomic History   Marital status: Married    Spouse name: Howell Pringle  Number of children: 3   Years of education: 6   Highest education level: Not on file  Occupational History   Occupation: Housewife   Occupation: [place]: Has not worked since 04/01/22 due to anxiety  Tobacco Use   Smoking status: Never    Passive exposure: Current (2023:  at work through vent in Medical sales representative with [employer] and cigarette smoke.  She has notified her boss.)   Smokeless tobacco: Never  Vaping Use   Vaping Use: Never used  Substance and Sexual Activity   Alcohol use: Never   Drug use: Never   Sexual activity: Yes    Birth control/protection: Surgical  Other Topics Concern   Not on file  Social History Narrative   Lives at  home with husband and children.   Oldest son now lives separately.   Social Determinants of Health   Financial Resource Strain: Low Risk  (04/13/2022)   Overall Financial Resource Strain (CARDIA)    Difficulty of Paying Living Expenses: Not hard at all  Food Insecurity: No Food Insecurity (04/13/2022)   Hunger Vital Sign    Worried About Running Out of Food in the Last Year: Never true    Ran Out of Food in the Last Year: Never true  Transportation Needs: No Transportation Needs (04/13/2022)   PRAPARE - Hydrologist (Medical): No    Lack of Transportation (Non-Medical): No  Physical Activity: Inactive (11/29/2018)   Exercise Vital Sign    Days of Exercise per Week: 0 days    Minutes of Exercise per Session: 0 min  Stress: Not on file  Social Connections: Unknown (11/29/2018)   Social Connection and Isolation Panel [NHANES]    Frequency of Communication with Friends and Family: Not on file    Frequency of Social Gatherings with Friends and Family: Not on file    Attends Religious Services: Not on file    Active Member of Clubs or Organizations: Not on file    Attends Archivist Meetings: Not on file    Marital Status: Married  Intimate Partner Violence: Not At Risk (02/18/2021)   Humiliation, Afraid, Rape, and Kick questionnaire    Fear of Current or Ex-Partner: No    Emotionally Abused: No    Physically Abused: No    Sexually Abused: No       Objective:   BP 120/80 (BP Location: Left Arm, Patient Position: Sitting, Cuff Size: Normal)   Pulse 76   Resp 16   Ht 5' 4.5" (1.638 m)   Wt 245 lb (111.1 kg)   LMP 03/30/2022 (Exact Date)   BMI 41.40 kg/m   Physical Exam Constitutional:      Appearance: She is obese.  HENT:     Head: Normocephalic and atraumatic.     Right Ear: Tympanic membrane, ear canal and external ear normal.     Left Ear: Tympanic membrane, ear canal and external ear normal.     Nose: Nose normal.     Mouth/Throat:      Mouth: Mucous membranes are moist.     Pharynx: Oropharynx is clear.     Comments: No exudate Eyes:     Extraocular Movements: Extraocular movements intact.     Conjunctiva/sclera: Conjunctivae normal.     Pupils: Pupils are equal, round, and reactive to light.  Neck:     Thyroid: No thyroid mass or thyromegaly.  Cardiovascular:     Rate and Rhythm: Normal rate and  regular rhythm.     Heart sounds: S1 normal and S2 normal. No murmur heard.    No friction rub. No S3 or S4 sounds.     Comments: No carotid bruits.  Carotid, radial, femoral, DP and PT pulses normal and equal.   Pulmonary:     Effort: Pulmonary effort is normal.     Breath sounds: Normal breath sounds and air entry.  Chest:  Breasts:    Right: No swelling, inverted nipple, mass or nipple discharge.     Left: No swelling, bleeding, mass or nipple discharge.  Abdominal:     General: Bowel sounds are normal.     Palpations: Abdomen is soft. There is no hepatomegaly, splenomegaly or mass.     Tenderness: There is no abdominal tenderness.     Hernia: No hernia is present.     Comments: Midline infraumbilical surgical scar  Genitourinary:    Comments: Normal external female genitalia. No vaginal discharge Cervix with well demarcated area of erythema from about noon to 5 O'Clock  No uterine or adnexal mass or tenderness.   Musculoskeletal:        General: Normal range of motion.     Cervical back: Normal range of motion and neck supple.     Right lower leg: No edema.     Left lower leg: No edema.  Feet:     Right foot:     Skin integrity: Skin integrity normal.     Toenail Condition: Right toenails are normal.     Left foot:     Skin integrity: Skin integrity normal.     Toenail Condition: Left toenails are normal.  Lymphadenopathy:     Head:     Right side of head: No submental or submandibular adenopathy.     Left side of head: No submental or submandibular adenopathy.     Cervical: No cervical adenopathy.      Upper Body:     Right upper body: No supraclavicular or axillary adenopathy.     Left upper body: No supraclavicular or axillary adenopathy.     Lower Body: No right inguinal adenopathy. No left inguinal adenopathy.  Skin:    General: Skin is warm.     Capillary Refill: Capillary refill takes less than 2 seconds.     Findings: No rash.     Comments: Hemangioma at mid left brow and left lateral ankle  Neurological:     General: No focal deficit present.     Mental Status: She is alert and oriented to person, place, and time.     Cranial Nerves: Cranial nerves 2-12 are intact.     Sensory: Sensation is intact.     Motor: Motor function is intact.     Coordination: Coordination is intact.     Gait: Gait is intact.     Deep Tendon Reflexes: Reflexes are normal and symmetric.  Psychiatric:        Mood and Affect: Mood is anxious.        Speech: Speech normal.        Behavior: Behavior is cooperative.        Thought Content: Thought content normal.     Comments: Constantly moving throughout history, picking at things, etc.      Assessment & Plan    CPE with pap For now, will schedule mammogram with [facility] in February.  They can change to later date if feel should wait to perhaps October.   CBC, CMP, FLP, HIV,  Hep C screen. Return FIT in 2 weeks. Encouraged influenza vaccine at pharmacy of choice as we are out today. She refused covid booster.  2.  Anxiety and likely panic disorder:  Referral to [facility] [place], LCSW-A  Also with history of depression and PTSD.  May need medication as well.    3.  Abnormal appearance of cervix:  referral to Gyn.  Checking with [facility], though do not believe they do colposcopy.  Email into [employer].  4.  Financial concerns:  email into [facility] as patient receiving bills regarding breast biopsy.  Need to find out what they do and do not cover.   Can have Morene Antu do some case management with her on this.  Patient has tried to  call billing, but states no one to interpret for her.  5.  Dyslipidemia:  FLP

## 2022-04-14 LAB — CBC WITH DIFFERENTIAL/PLATELET
Basophils Absolute: 0 10*3/uL (ref 0.0–0.2)
Basos: 0 %
EOS (ABSOLUTE): 0.1 10*3/uL (ref 0.0–0.4)
Eos: 1 %
Hematocrit: 38.5 % (ref 34.0–46.6)
Hemoglobin: 12.1 g/dL (ref 11.1–15.9)
Immature Grans (Abs): 0 10*3/uL (ref 0.0–0.1)
Immature Granulocytes: 0 %
Lymphocytes Absolute: 1.9 10*3/uL (ref 0.7–3.1)
Lymphs: 21 %
MCH: 25.2 pg — ABNORMAL LOW (ref 26.6–33.0)
MCHC: 31.4 g/dL — ABNORMAL LOW (ref 31.5–35.7)
MCV: 80 fL (ref 79–97)
Monocytes Absolute: 0.7 10*3/uL (ref 0.1–0.9)
Monocytes: 7 %
Neutrophils Absolute: 6.4 10*3/uL (ref 1.4–7.0)
Neutrophils: 71 %
Platelets: 253 10*3/uL (ref 150–450)
RBC: 4.8 x10E6/uL (ref 3.77–5.28)
RDW: 14.7 % (ref 11.7–15.4)
WBC: 9.1 10*3/uL (ref 3.4–10.8)

## 2022-04-14 LAB — COMPREHENSIVE METABOLIC PANEL
ALT: 11 IU/L (ref 0–32)
AST: 11 IU/L (ref 0–40)
Albumin/Globulin Ratio: 1.4 (ref 1.2–2.2)
Albumin: 4 g/dL (ref 3.9–4.9)
Alkaline Phosphatase: 101 IU/L (ref 44–121)
BUN/Creatinine Ratio: 20 (ref 9–23)
BUN: 13 mg/dL (ref 6–24)
Bilirubin Total: 0.2 mg/dL (ref 0.0–1.2)
CO2: 25 mmol/L (ref 20–29)
Calcium: 8.8 mg/dL (ref 8.7–10.2)
Chloride: 100 mmol/L (ref 96–106)
Creatinine, Ser: 0.64 mg/dL (ref 0.57–1.00)
Globulin, Total: 2.9 g/dL (ref 1.5–4.5)
Glucose: 80 mg/dL (ref 70–99)
Potassium: 4.2 mmol/L (ref 3.5–5.2)
Sodium: 139 mmol/L (ref 134–144)
Total Protein: 6.9 g/dL (ref 6.0–8.5)
eGFR: 113 mL/min/{1.73_m2} (ref >59)

## 2022-04-14 LAB — LIPID PANEL W/O CHOL/HDL RATIO
Cholesterol, Total: 178 mg/dL (ref 100–199)
HDL: 58 mg/dL (ref >39)
LDL Chol Calc (NIH): 107 mg/dL — ABNORMAL HIGH (ref 0–99)
Triglycerides: 71 mg/dL (ref 0–149)
VLDL Cholesterol Cal: 13 mg/dL (ref 5–40)

## 2022-04-14 LAB — HIV ANTIBODY (ROUTINE TESTING W REFLEX): HIV Screen 4th Generation wRfx: NONREACTIVE

## 2022-04-14 LAB — HEPATITIS C ANTIBODY: Hep C Virus Ab: NONREACTIVE

## 2022-04-15 ENCOUNTER — Telehealth: Payer: Self-pay

## 2022-04-15 LAB — CYTOLOGY - PAP

## 2022-04-15 NOTE — Telephone Encounter (Signed)
Via, Caren Griffins # [phone], Spanish Interpreter/[employer], returned call to patient, who states she had a biopsy in 01/2022, and has now received a bill, needs to know if bill will be covered by pink card ([facility]). Patient informed to send a copy of front and back of bill, fax number provided. Patient informed will call back to inform if will be covered. Patient verbalized understanding.

## 2022-04-18 IMAGING — MG MM DIGITAL SCREENING BILAT W/ TOMO AND CAD
6 of 10 series · 6 of 30 positions shown · non-contrast
Comparison: None.

CLINICAL DATA: Screening.

EXAM:
DIGITAL SCREENING BILATERAL MAMMOGRAM WITH TOMOSYNTHESIS AND CAD
TECHNIQUE: Bilateral screening digital craniocaudal and mediolateral oblique
mammograms were obtained. Bilateral screening digital breast
tomosynthesis was performed. The images were evaluated with
computer-aided detection.

[R MLO synth-2D]
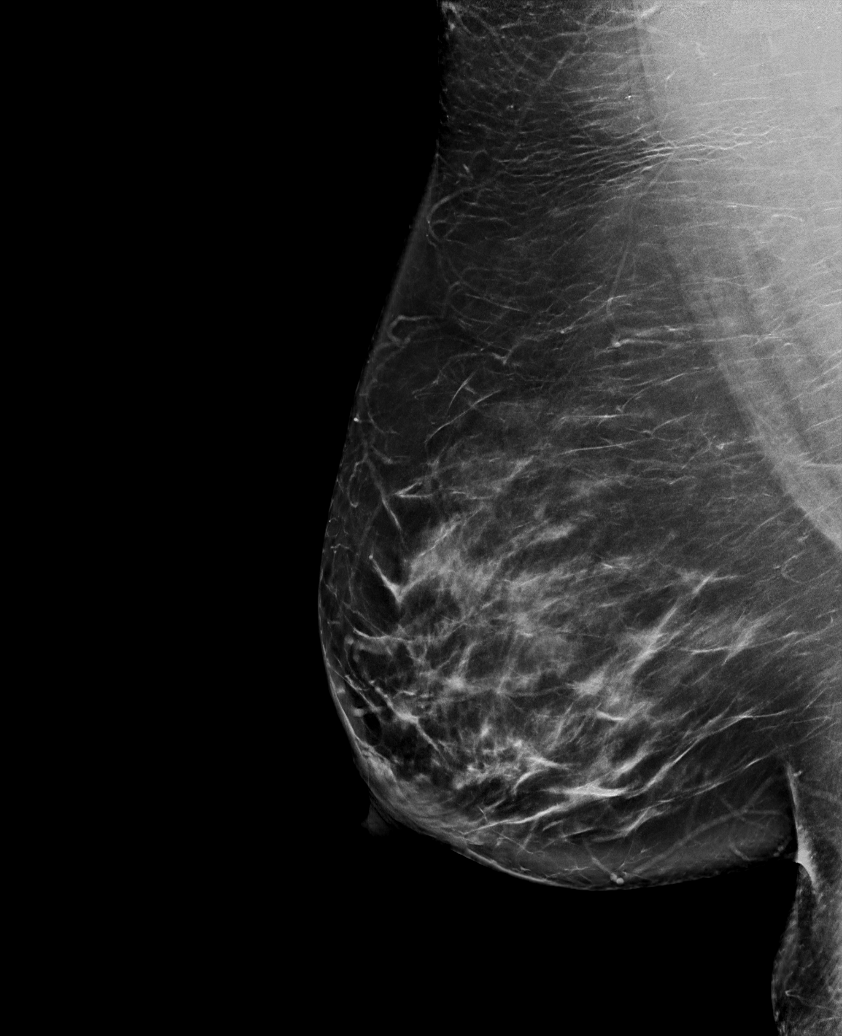

[R CC synth-2D]
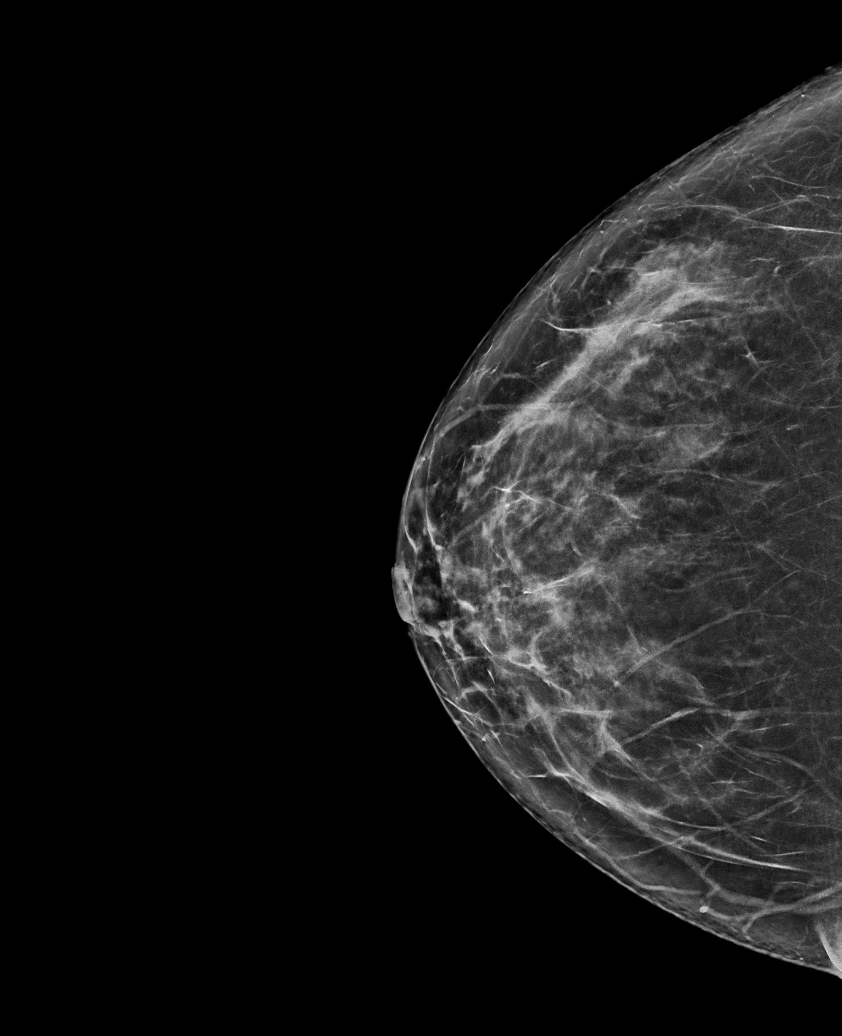

[L CC synth-2D]
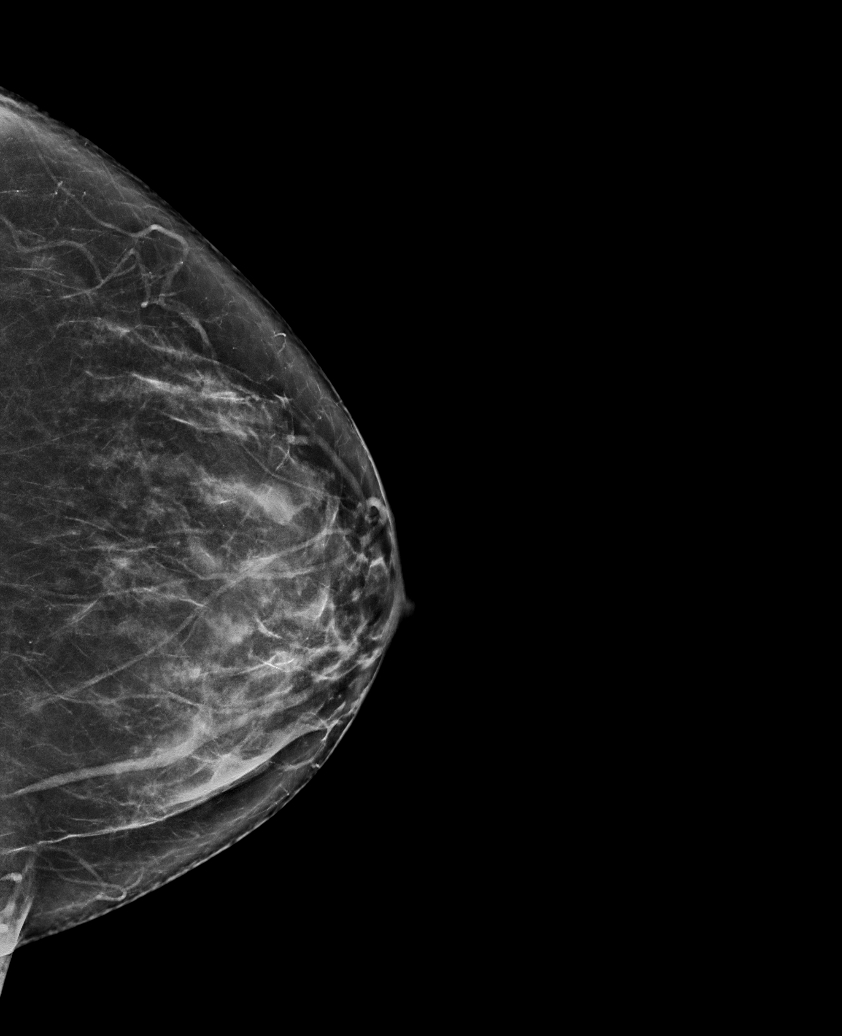

[L MLO synth-2D (1 of 2)]
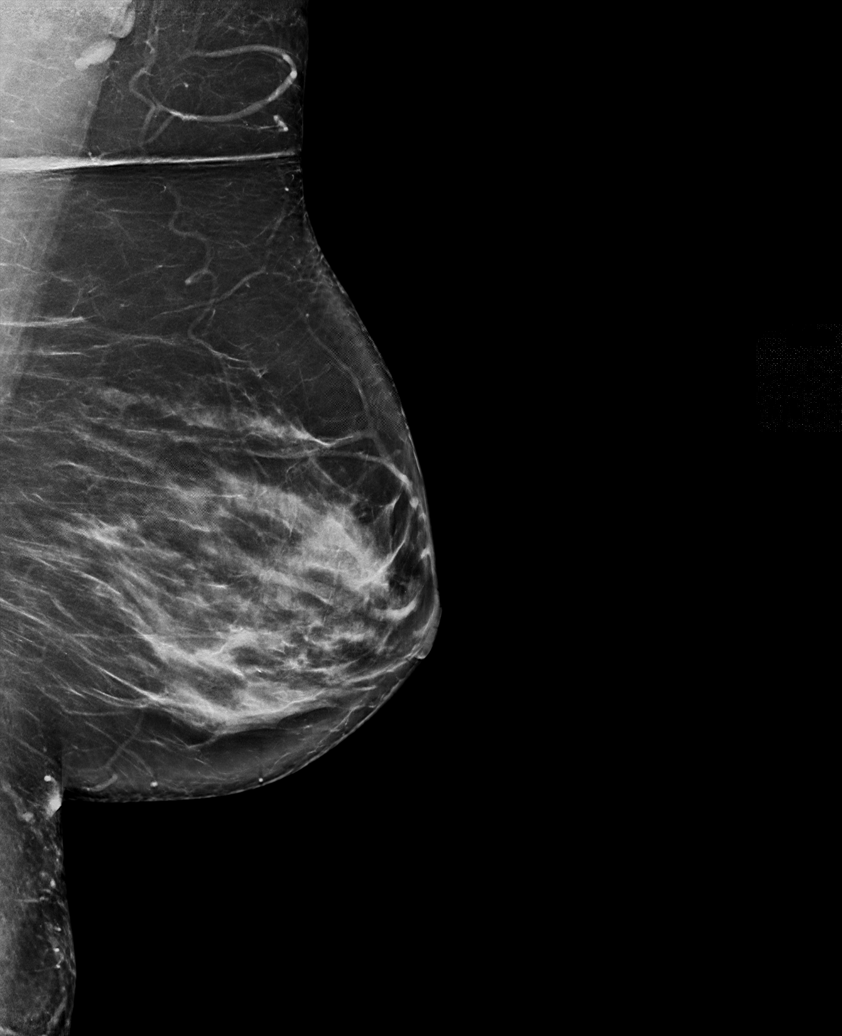

[L MLO synth-2D (2 of 2)]
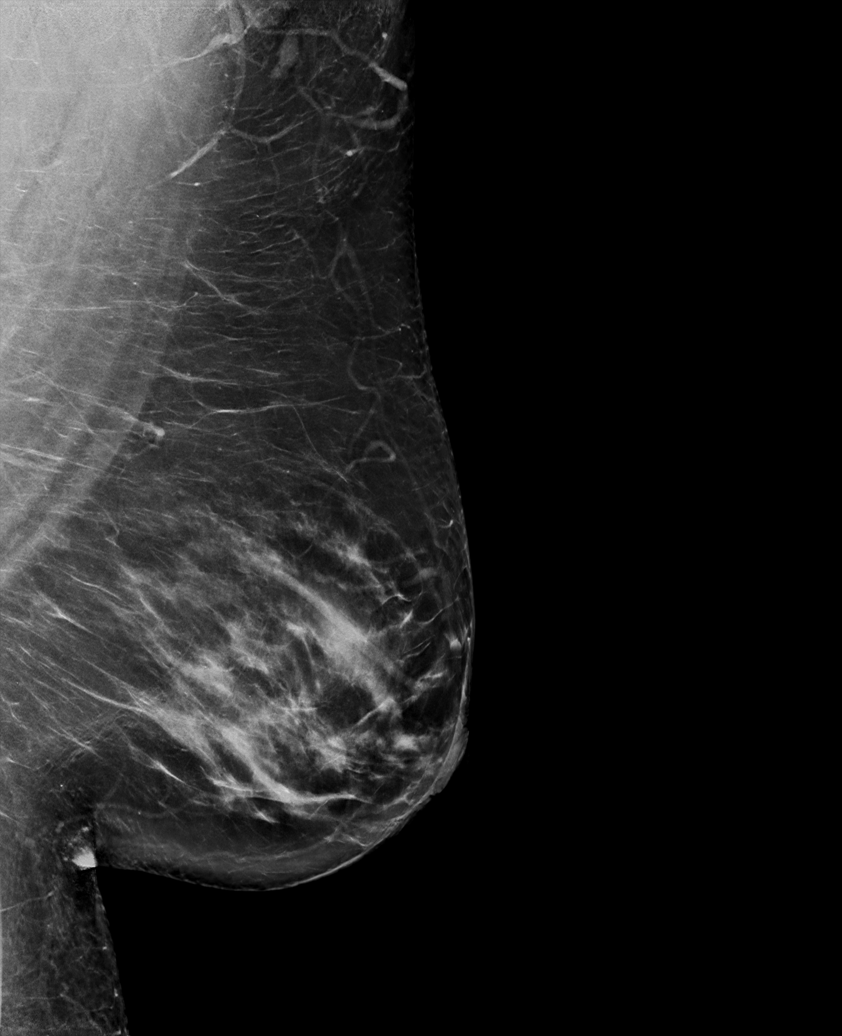

[L MLO tomo · tomo slice 45/90.0]
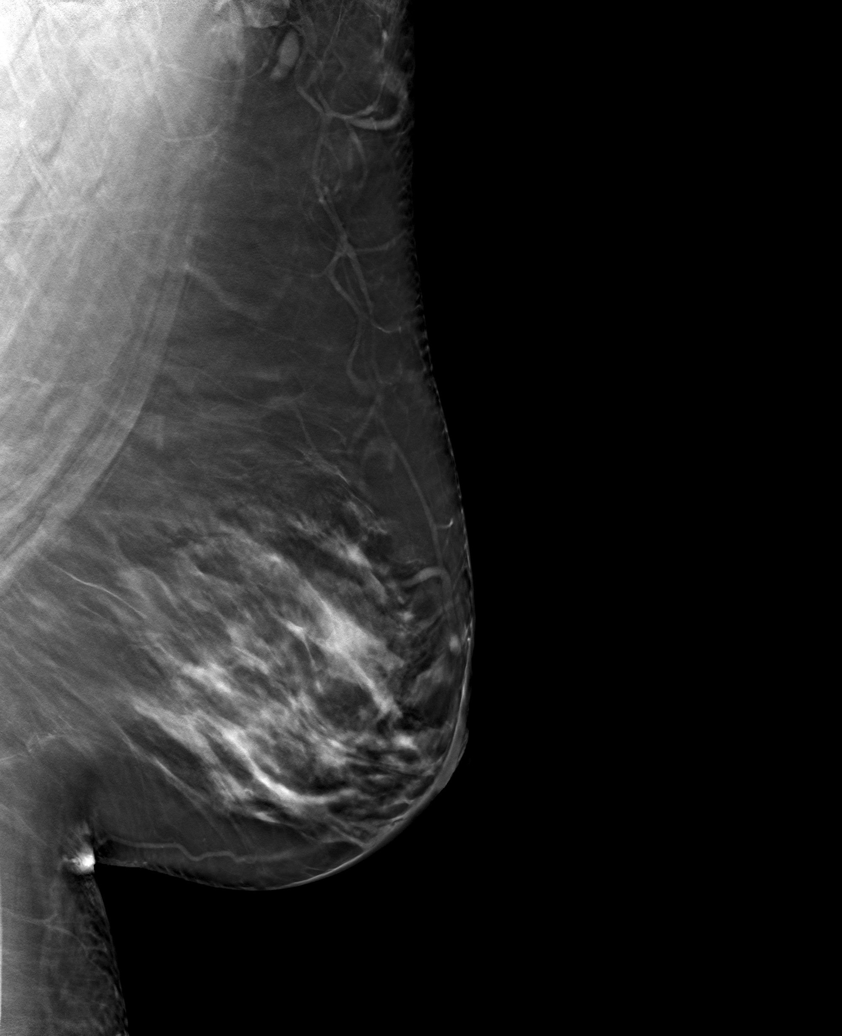

[6 of 30 positions shown; findings below may reference images not displayed]

ACR Breast Density Category c: The breast tissue is heterogeneously
dense, which may obscure small masses.
FINDINGS: In the left breast, a possible mass warrants further evaluation. In
the right breast, no findings suspicious for malignancy.
IMPRESSION: Further evaluation is suggested for possible mass in the left
breast.

RECOMMENDATION:
Ultrasound of the left breast. (Code:[id number])

The patient will be contacted regarding the findings, and additional
imaging will be scheduled.

BI-RADS CATEGORY  0: Incomplete. Need additional imaging evaluation
and/or prior mammograms for comparison.

## 2022-04-28 ENCOUNTER — Encounter: Payer: Self-pay | Admitting: Internal Medicine

## 2022-05-29 IMAGING — US US BREAST*L* LIMITED INC AXILLA
1 series · 5 of 5 positions shown · non-contrast
Comparison: Bilateral screening mammogram dated 05/18/2021.

CLINICAL DATA: Possible mass in the outer left breast on a recent
screening mammogram.

EXAM:
ULTRASOUND OF THE LEFT BREAST

[Series 1: us breast*left* limited inc axilla · 0.06mm/px · 5 of 5 slices shown]
[im 1/5]
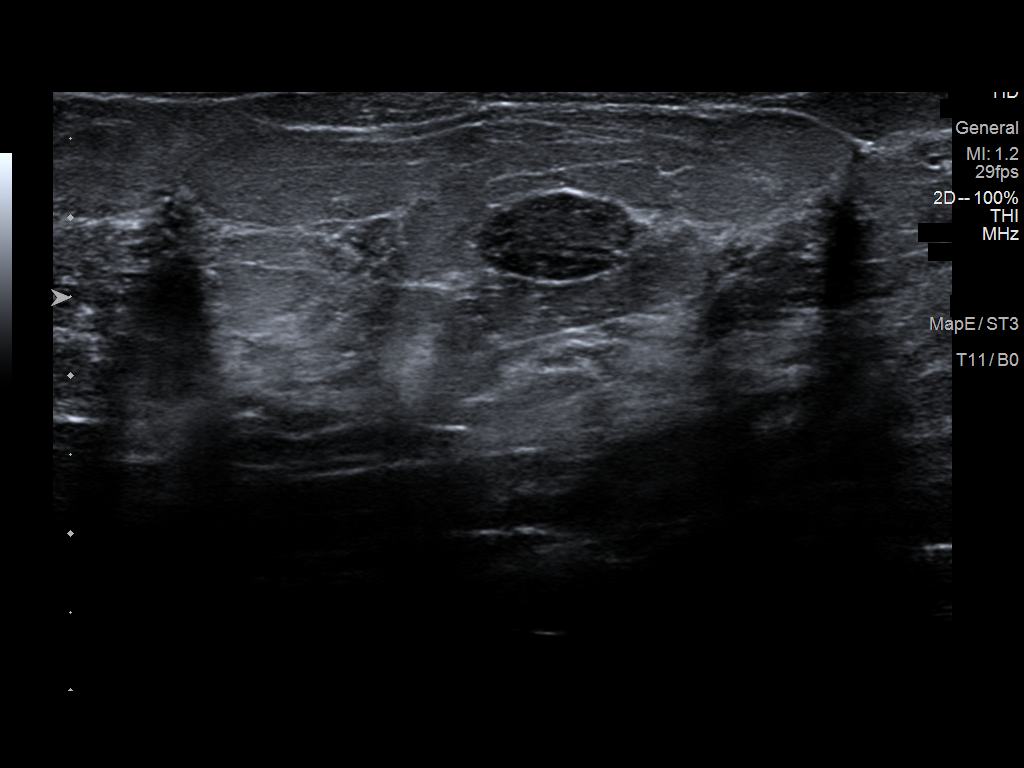
[im 2/5]
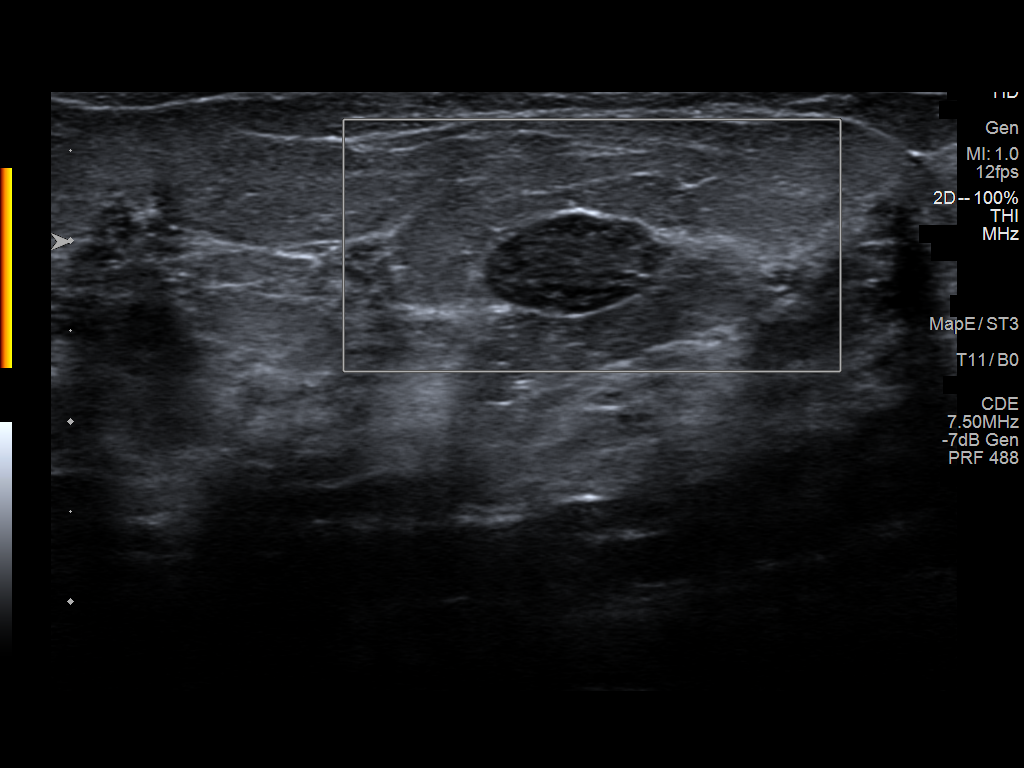
[im 3/5]
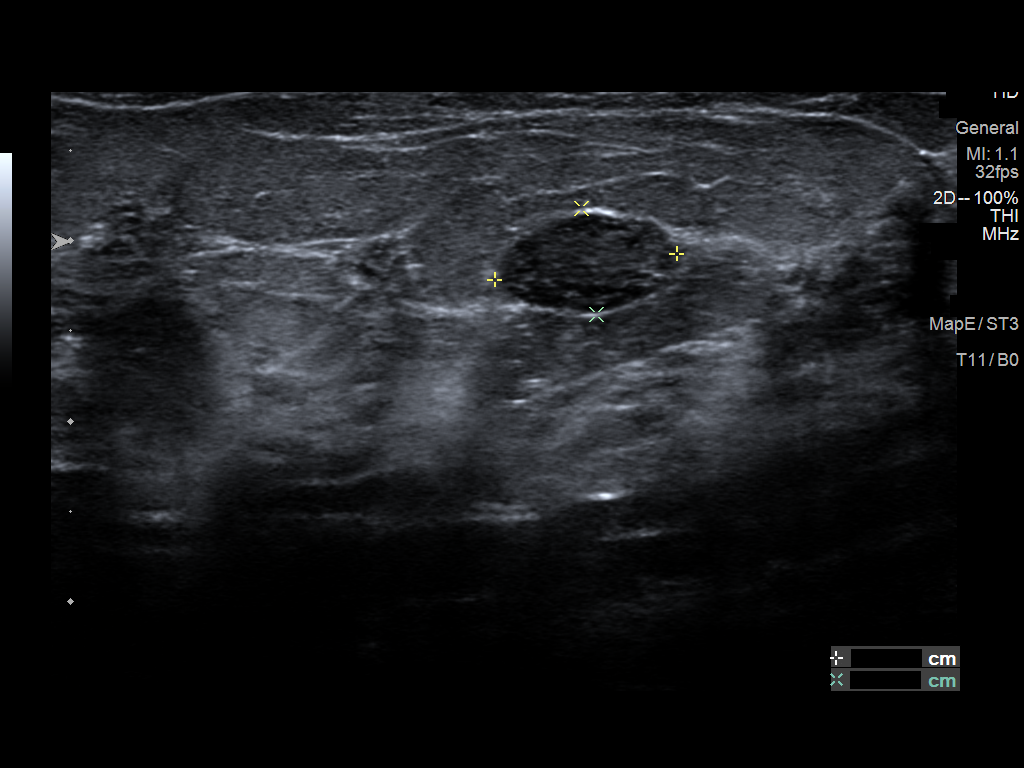
[im 4/5]
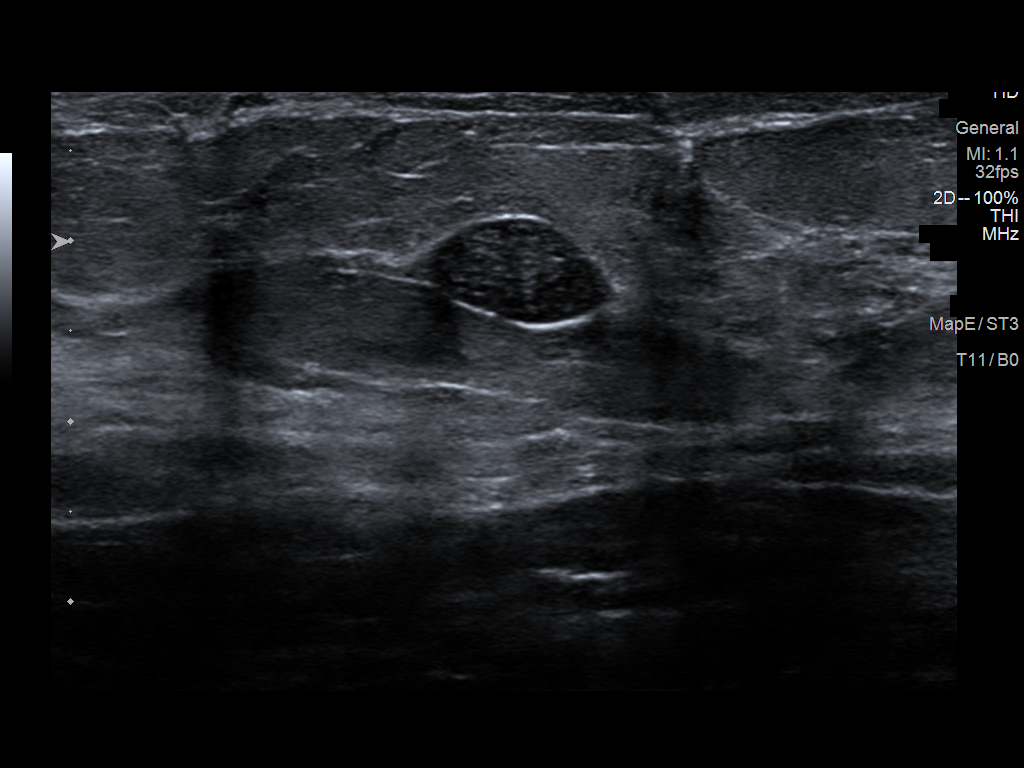
[im 5/5]
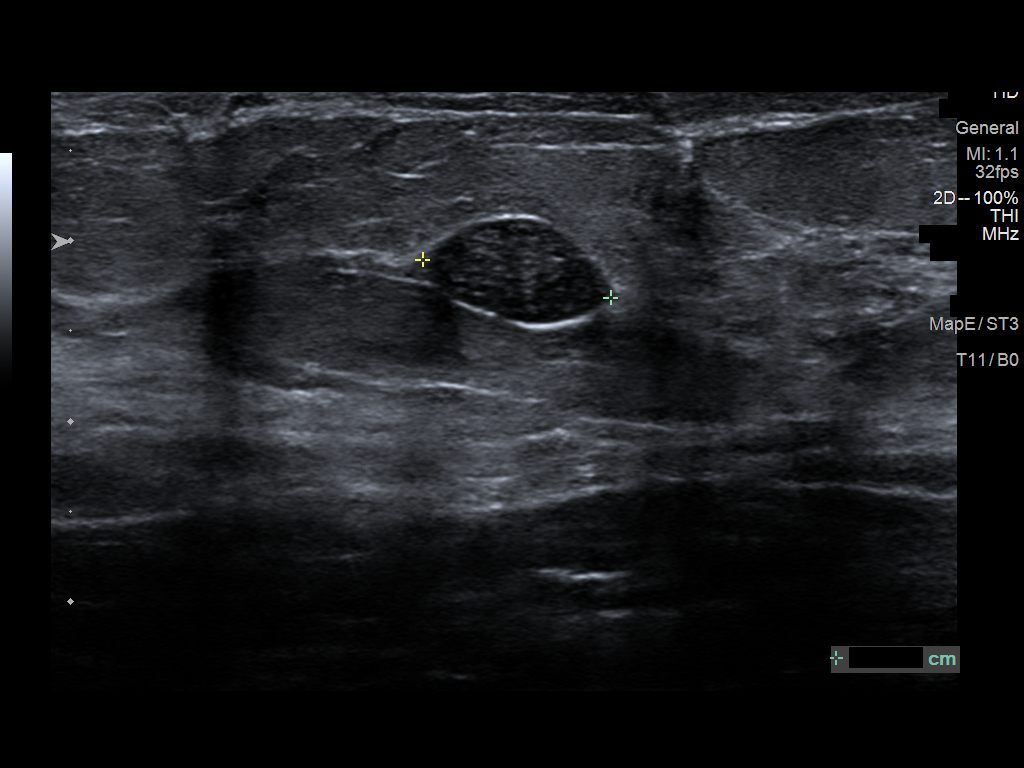

[5 of 5 positions shown; findings below may reference images not displayed]

FINDINGS: Targeted ultrasound is performed, showing a 1.1 x 1.0 x 0.6 cm oval,
horizontally oriented, circumscribed, mildly hypoechoic mass in the
3 o'clock position of the left breast, 2 cm from the nipple. This
exhibits mild increased through transmission of sound and has no
internal blood flow with power Doppler.
IMPRESSION: 1.1 cm probable benign fibroadenoma in the 3 o'clock position of the
left breast.

RECOMMENDATION:
Left breast ultrasound in 6 months. The option of ultrasound-guided
core needle biopsy was also discussed with the patient but not
recommended at this time. She is currently comfortable with the 6
month followup ultrasound.

I have discussed the findings and recommendations with the patient.
If applicable, a reminder letter will be sent to the patient
regarding the next appointment.

BI-RADS CATEGORY  3: Probably benign.

## 2022-07-19 ENCOUNTER — Ambulatory Visit: Payer: Self-pay | Admitting: Internal Medicine

## 2022-07-19 VITALS — BP 130/76 | HR 76 | Resp 16 | Ht 64.5 in | Wt 255.0 lb

## 2022-07-19 DIAGNOSIS — F419 Anxiety disorder, unspecified: Secondary | ICD-10-CM

## 2022-09-08 ENCOUNTER — Ambulatory Visit (INDEPENDENT_AMBULATORY_CARE_PROVIDER_SITE_OTHER): Payer: Self-pay | Admitting: Family Medicine

## 2022-09-08 ENCOUNTER — Ambulatory Visit: Payer: Self-pay | Admitting: Internal Medicine

## 2022-09-08 ENCOUNTER — Encounter: Payer: Self-pay | Admitting: Family Medicine

## 2022-09-08 ENCOUNTER — Other Ambulatory Visit (HOSPITAL_COMMUNITY)
Admission: RE | Admit: 2022-09-08 | Discharge: 2022-09-08 | Disposition: A | Discharge status: Home / Self Care | Payer: Self-pay | Source: Ambulatory Visit | Attending: Family Medicine | Admitting: Family Medicine

## 2022-09-08 VITALS — BP 111/74 | HR 83 | Wt 255.1 lb

## 2022-09-08 DIAGNOSIS — N72 Inflammatory disease of cervix uteri: Secondary | ICD-10-CM | POA: Insufficient documentation

## 2022-09-08 DIAGNOSIS — Z01419 Encounter for gynecological examination (general) (routine) without abnormal findings: Secondary | ICD-10-CM

## 2022-09-08 NOTE — Progress Notes (Signed)
ANNUAL EXAM Patient name: Janet Cardenas MRN [id number]  Date of birth: 10-04-1980 Chief Complaint:   Gynecologic Exam  History of Present Illness:   Janet Cardenas is a 42 y.o. No obstetric history on file. Hispanic female being seen today for a pelvic exam.  Current complaints: referred for cervical inflammation  Patient's last menstrual period was 08/29/2022 (exact date).   The pregnancy intention screening data noted above was reviewed. Potential methods of contraception were discussed. The patient elected to proceed with No data recorded.   Last pap 04/13/2022. Results were: NILM w/ HRHPV negative. H/O abnormal pap: no Last mammogram: 05/18/21. Results were: abnormal Fibroademona . Family h/o breast cancer: no Last colonoscopy: n/a     01/07/2020   11:53 AM  Depression screen PHQ 2/9  Decreased Interest 2  Down, Depressed, Hopeless 2  PHQ - 2 Score 4  Altered sleeping 2  Tired, decreased energy 3  Change in appetite 1  Feeling bad or failure about yourself  3  Trouble concentrating 3  Moving slowly or fidgety/restless 0  Suicidal thoughts 2  PHQ-9 Score 18  Difficult doing work/chores Somewhat difficult        01/07/2020   11:53 AM  GAD 7 : Generalized Anxiety Score  Nervous, Anxious, on Edge 0  Control/stop worrying 0  Worry too much - different things 1  Trouble relaxing 2  Restless 2  Easily annoyed or irritable 2  Afraid - awful might happen 3  Total GAD 7 Score 10  Anxiety Difficulty Very difficult     Review of Systems:   Pertinent items are noted in HPI Denies any headaches, blurred vision, fatigue, shortness of breath, chest pain, abdominal pain, abnormal vaginal discharge/itching/odor/irritation, problems with periods, bowel movements, urination, or intercourse unless otherwise stated above. Pertinent History Reviewed:  Reviewed past medical,surgical, social and family history.  Reviewed problem list, medications and  allergies. Physical Assessment:   Vitals:   09/08/22 1342  BP: 111/74  Pulse: 83  Weight: 255 lb 1.6 oz (115.7 kg)  Body mass index is 43.11 kg/m.        Physical Examination:   General appearance - well appearing, and in no distress  Mental status - alert, oriented to person, place, and time  Psych:  She has a normal mood and affect  Skin - warm and dry, normal color, no suspicious lesions noted  Chest - effort normal, all lung fields clear to auscultation bilaterally  Heart - normal rate and regular rhythm  Neck:  midline trachea, no thyromegaly or nodules  Breasts - breasts appear normal, no suspicious masses, no skin or nipple changes or  axillary nodes  Abdomen - soft, nontender, nondistended, no masses or organomegaly  Pelvic - VULVA: normal appearing vulva with no masses, tenderness or lesions  VAGINA: normal appearing vagina with normal color and discharge, no lesions  CERVIX: abnormal appearing cervix. Left side with circumscribed redness (cervicitis) without obvious discharge or lesions, no CMT  Thin prep pap is not done  UTERUS: uterus is felt to be normal size, shape, consistency and nontender   ADNEXA: No adnexal masses or tenderness noted.  Rectal - normal rectal, good sphincter tone, no masses felt. Hemoccult: n/a  Extremities:  No swelling or varicosities noted  Chaperone present for exam  No results found for this or any previous visit (from the past 24 hour(s)).  Assessment & Plan:  1) Well-Woman Exam  2) Cervicitis- Discussed with pt that  this may be caused by STI or BV/Candida/ HIV negative in January. If results positive for swabs, will recheck HIV/RPR.  Labs/procedures today: Vaginal swabs collected  Mammogram: schedule screening mammo as soon as possible, or sooner if problems Colonoscopy: @ 42yo, or sooner if problems    Myrtie Hawk, DO 09/08/2022 2:23 PM

## 2022-09-09 LAB — CERVICOVAGINAL ANCILLARY ONLY
Bacterial Vaginitis (gardnerella): NEGATIVE
Candida Glabrata: NEGATIVE
Candida Vaginitis: NEGATIVE
Chlamydia: NEGATIVE
Comment: NEGATIVE
Comment: NEGATIVE
Comment: NEGATIVE
Comment: NEGATIVE
Comment: NEGATIVE
Comment: NORMAL
Neisseria Gonorrhea: NEGATIVE
Trichomonas: NEGATIVE

## 2022-10-20 ENCOUNTER — Encounter (HOSPITAL_COMMUNITY): Payer: Self-pay

## 2022-10-20 ENCOUNTER — Ambulatory Visit (HOSPITAL_COMMUNITY)
Admission: EM | Admit: 2022-10-20 | Discharge: 2022-10-20 | Disposition: A | Discharge status: Home / Self Care | Payer: Self-pay | Attending: Emergency Medicine | Admitting: Emergency Medicine

## 2022-10-20 DIAGNOSIS — M25562 Pain in left knee: Secondary | ICD-10-CM

## 2022-10-20 DIAGNOSIS — M25561 Pain in right knee: Secondary | ICD-10-CM

## 2022-10-20 MED ORDER — KETOROLAC TROMETHAMINE 30 MG/ML IJ SOLN
INTRAMUSCULAR | Status: AC
  Filled 2022-10-20: qty 1

## 2022-10-20 MED ORDER — KETOROLAC TROMETHAMINE 30 MG/ML IJ SOLN
30.0000 mg | Freq: Once | INTRAMUSCULAR | Status: AC
  Administered 2022-10-20: 30 mg via INTRAMUSCULAR

## 2022-10-20 MED ORDER — PREDNISONE 20 MG PO TABS: 40.0000 mg | ORAL_TABLET | Freq: Every day | ORAL | 0 refills | Status: DC

## 2022-10-20 NOTE — ED Provider Notes (Signed)
[facility]    CSN: [id number] Arrival date & time: 10/20/22  1326      History   Chief Complaint Chief Complaint  Patient presents with   Knee Pain    HPI Janet Cardenas is a 42 y.o. female.   Patient presents for evaluation of bilateral knee pain present for 3 months, worsening in severity over the past 2 weeks.  Pain is present to the anterior of the knee, described as a stabbing and burning sensation, can be felt with long periods of sitting and standing.  Able to bear weight.  Has been managing symptoms with Tylenol but no improvement seen.  Denies injury or trauma, numbness or tingling.  Spanish interpreter used Past Medical History:  Diagnosis Date   Depression    Gestational (pregnancy-induced) hypertension without significant proteinuria, complicating childbirth    Obesity (BMI 35.0-39.9 without comorbidity)    RBC microcytosis 04/01/2019    Patient Active Problem List   Diagnosis Date Noted   Cervical inflammation 04/13/2022   Panic attacks 04/13/2022   Anxiety 04/13/2022   Posttraumatic stress disorder 04/13/2022   Dyslipidemia 02/14/2021   Class 3 severe obesity due to excess calories without serious comorbidity with body mass index (BMI) of 40.0 to 44.9 in adult (HCC) 06/06/2020   Stress 06/06/2020   Depression, recurrent (HCC)    Left upper quadrant pain 11/15/2019   Constipation 11/15/2019   RBC microcytosis 04/01/2019   Gestational (pregnancy-induced) hypertension without significant proteinuria, complicating childbirth    Obesity (BMI 35.0-39.9 without comorbidity)     Past Surgical History:  Procedure Laterality Date   CESAREAN SECTION  2009   TUBAL LIGATION  2009   With C/S     OB History     Gravida  3   Para  3   Term  3   Preterm  0   AB  0   Living  3      SAB  0   IAB  0   Ectopic  0   Multiple  0   Live Births  3            Home Medications    Prior to Admission medications    Medication Sig Start Date End Date Taking? Authorizing Provider  Naproxen Sodium (ALEVE PO) Take by mouth as needed.    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  She has notified her boss.)   Smokeless tobacco: Never  Vaping Use   Vaping status: Never Used  Substance Use Topics   Alcohol use: Never   Drug use: Never     Allergies   Patient has no known allergies.   Review of Systems Review of Systems   Physical Exam Triage Vital Signs ED Triage Vitals  Encounter Vitals Group     BP 10/20/22 1509 109/65     Systolic BP Percentile --      Diastolic  BP Percentile --      Pulse Rate 10/20/22 1509 64     Resp 10/20/22 1509 16     Temp 10/20/22 1509 98.3 F (36.8 C)     Temp Source 10/20/22 1509 Oral     SpO2 10/20/22 1509 98 %     Weight --      Height --      Head Circumference --      Peak Flow --      Pain Score 10/20/22 1510 9     Pain Loc --      Pain Education --      Exclude from Growth Chart --    No data found.  Updated Vital Signs BP 109/65 (BP Location: Right Arm)   Pulse 64   Temp 98.3 F (36.8 C) (Oral)   Resp 16   LMP 10/17/2022   SpO2 98%   Visual Acuity Right Eye Distance:   Left Eye Distance:   Bilateral Distance:    Right Eye Near:   Left Eye Near:    Bilateral Near:     Physical Exam Constitutional:      Appearance: Normal appearance.  Eyes:     Extraocular Movements: Extraocular movements intact.  Pulmonary:     Effort: Pulmonary effort is normal.  Musculoskeletal:      Comments: Tenderness and mild to moderate swelling present to the anterior of the bilateral knee, no ligament laxity noted, 2+ popliteal pulses, able to complete range of motion and bear weight  Neurological:     Mental Status: She is alert and oriented to person, place, and time. Mental status is at baseline.      UC Treatments / Results  Labs (all labs ordered are listed, but only abnormal results are displayed) Labs Reviewed - No data to display  EKG   Radiology No results found.  Procedures Procedures (including critical care time)  Medications Ordered in UC Medications - No data to display  Initial Impression / Assessment and Plan / UC Course  I have reviewed the triage vital signs and the nursing notes.  Pertinent labs & imaging results that were available during my care of the patient were reviewed by me and considered in my medical decision making (see chart for details).  Acute pain of both knees  Acute on chronic condition, has been evaluated for similar symptoms in the past by PCP, endorses she has completed PT in the past without improvement in symptoms, no injury therefore imaging deferred, morbidly obese, most likely irritation to the joint with possible arthritis, discussed this with patient, Toradol injection given in office, prescribed prednisone burst for outpatient use, bilateral knee sleeves placed by nursing staff to be used as needed for stability and support, recommended RICE, heat massage stretching and activity as tolerated, given walking referral to orthopedics if symptoms continue to persist past use of supportive care Final Clinical Impressions(s) / UC Diagnoses   Final diagnoses:  None   Discharge Instructions   None    ED Prescriptions   None    PDMP not reviewed this encounter.   Valinda Hoar, NP 10/20/22 1545

## 2022-10-20 NOTE — Discharge Instructions (Addendum)
Lo ms probable es que su dolor sea causado por irritacin e inflamacin de la articulacin similar a la artritis.  Le han administrado una inyeccin de Toradol hoy aqu en el consultorio para ayudar a reducir la inflamacin e idealmente ver alguna mejora en aproximadamente 30 minutos a una hora.  A partir de maana tome prednisona todas las maanas con la comida durante 5 das para continuar con el proceso anterior, puede continuar tomando Tylenol adems de esto.  Puede usar una almohadilla trmica en intervalos de 15 minutos segn sea necesario para mayor comodidad.  El personal de enfermera ha aplicado mangas de compresin que aaden estabilidad y apoyo al completar la actividad y pueden usarse segn sea necesario.  Comience a estirar el rea afectada diariamente durante 10 minutos segn lo tolere para aflojar an ms los msculos.   Cuando est acostado, coloque una almohada debajo y [place] rodillas para brindar apoyo.  Si el dolor persiste despus del tratamiento recomendado o reaparece, puede ser beneficioso realizar un seguimiento con un especialista en ortopedia para Stage manager, este mdico se especializa en los [employer] y Magazine features editor sus sntomas a Air cabin crew con opciones que [place], [place] otras, imgenes, medicamentos o fisioterapia.  Your pain is most likely caused by irritation and inflammation to the joint similar to arthritis  You have been given an injection of Toradol today here in the office to help reduce inflammation and ideally you will see some improvement in about 30 minutes to an hour  Starting tomorrow take prednisone every morning with food for 5 days to continue the above process, may continue to take Tylenol in addition to this  You may use heating pad in 15 minute intervals as needed for additional comfort,  Compression sleeves have been applied by nursing staff, these add stability and support when completing activity, may use as needed  Begin  stretching affected area daily for 10 minutes as tolerated to further loosen muscles   When lying down place pillow underneath and between knees for support  If pain persist after recommended treatment or reoccurs if may be beneficial to follow up with orthopedic specialist for evaluation, this doctor specializes in the bones and can manage your symptoms long-term with options such as but not limited to imaging, medications or physical therapy

## 2022-10-20 NOTE — ED Triage Notes (Signed)
Per Interpreter Mauricio [phone]  Patient c/o bilateral knee pain x  3 months, but worsened 2 weeks ago.  Patient states she has been taking Tylenol with no relief. Patient states the last dose was yesterday.

## 2022-11-09 ENCOUNTER — Telehealth: Payer: Self-pay

## 2022-11-09 NOTE — Telephone Encounter (Signed)
Patient needs an appointment for Strong knee pain, patient stated she has been to urgent care before but pain has not go away.  Pain has been there for a long time now and pain is getting worse, pain doesn't let her sleep, Patient has taken medication that were prescribed at urgent care but she has finished meds and still has the pain.   Will call patient if there is a cancellation

## 2022-11-11 NOTE — Telephone Encounter (Signed)
Patient has been scheduled

## 2022-11-14 ENCOUNTER — Encounter: Payer: Self-pay | Admitting: Internal Medicine

## 2022-11-14 ENCOUNTER — Ambulatory Visit: Payer: Self-pay | Admitting: Internal Medicine

## 2022-11-14 VITALS — BP 130/72 | HR 66 | Resp 12 | Ht 64.5 in | Wt 249.0 lb

## 2022-11-14 DIAGNOSIS — N12 Tubulo-interstitial nephritis, not specified as acute or chronic: Secondary | ICD-10-CM | POA: Insufficient documentation

## 2022-11-14 DIAGNOSIS — M25561 Pain in right knee: Secondary | ICD-10-CM

## 2022-11-14 DIAGNOSIS — G8929 Other chronic pain: Secondary | ICD-10-CM

## 2022-11-14 DIAGNOSIS — M25562 Pain in left knee: Secondary | ICD-10-CM

## 2022-11-14 DIAGNOSIS — Z599 Problem related to housing and economic circumstances, unspecified: Secondary | ICD-10-CM

## 2022-11-14 MED ORDER — NAPROXEN 500 MG PO TABS: 500.0000 mg | ORAL_TABLET | Freq: Two times a day (BID) | ORAL | 2 refills | Status: DC

## 2022-11-14 NOTE — Progress Notes (Signed)
    Subjective:    Patient ID: Janet Cardenas, female   DOB: 11-Aug-1980, 42 y.o.   MRN: [id number]   HPI  Bilateral knee pain:  Has been seen for this before.  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   States the same pain. She was sent to PT 09/2021, but did not feel she was getting any better after 4-5 visits, so stopped.  She was given home exercises with PT, but does not sound like she is doing these often.   Points mainly to soft tissue of medial lower thigh bilaterally and then if really bad, can encompass entire knee bilaterally.   Never has redness.   Pain all the time, but worse with standing.   Normal or negative ANA, Sed Rate, and RF in June of 2023.   She apparently goes to work, but sits much of time due to pain.  She has been told she will be let go if she cannot be more functional at work. She has been sent twice for Xrays of her knees, but gives multiple reasons why she did not go.   Taking tylenol 1000 mg at work for pain control--does help.    Current Meds  Medication Sig   acetaminophen (TYLENOL) 500 MG tablet Take 500 mg by mouth every 6 (six) hours as needed.   Menthol-Methyl Salicylate (ICY HOT BALM EXTRA STRENGTH EX) Apply topically.   No Known Allergies   Review of Systems    Objective:   BP 130/72 (BP Location: Left Arm, Patient Position: Sitting, Cuff Size: Large)   Pulse 66   Resp 12   Ht 5' 4.5" (1.638 m)   Wt 249 lb (112.9 kg)   LMP 10/17/2022   BMI 42.08 kg/m   Physical Exam Legs are obese Begins constantly rubbing medial aspect of lower thighs and upper medial lower leg bilaterally after entering room.   Full ROM.   No definite effusion and no erythema or increased warmth of knees. Tender along lateral joint margins bilaterally and medial and posterior knee on the right only.   Most tender over pes anserine bursa area bilaterally. Mild pain on  right with compression and movement of patella over anterior joint.  Does have some clicking and grinding with this motion.   No collateral or cruciate ligament laxity or pain with stress maneuvers.   Gait is smooth and without obvious discomfort.   Assessment & Plan  Chronic bilateral knee pain:  historically, have been unable to get follow through with evaluation. Will send to [facility] Hopefully, she can get xrays done at her visit as we have not been successful setting that up for her.   Naproxen 500 mg with food twice daily Referral to [facility], Amparo Bristol for support with applying for financial assistance for [facility] as sounds like she already has a significant bill and has not applied.

## 2022-12-05 ENCOUNTER — Ambulatory Visit (INDEPENDENT_AMBULATORY_CARE_PROVIDER_SITE_OTHER): Payer: Self-pay | Admitting: Orthopaedic Surgery

## 2022-12-05 ENCOUNTER — Other Ambulatory Visit (INDEPENDENT_AMBULATORY_CARE_PROVIDER_SITE_OTHER): Payer: Self-pay

## 2022-12-05 DIAGNOSIS — G8929 Other chronic pain: Secondary | ICD-10-CM

## 2022-12-05 DIAGNOSIS — M25562 Pain in left knee: Secondary | ICD-10-CM

## 2022-12-05 DIAGNOSIS — M25561 Pain in right knee: Secondary | ICD-10-CM

## 2022-12-05 MED ORDER — METHYLPREDNISOLONE ACETATE 40 MG/ML IJ SUSP
40.0000 mg | INTRAMUSCULAR | Status: AC | PRN
Start: 2022-12-05 — End: 2022-12-05
  Administered 2022-12-05: 40 mg via INTRA_ARTICULAR

## 2022-12-05 MED ORDER — CELECOXIB 200 MG PO CAPS: 200.0000 mg | ORAL_CAPSULE | Freq: Two times a day (BID) | ORAL | 1 refills | Status: DC | PRN

## 2022-12-05 MED ORDER — LIDOCAINE HCL 1 % IJ SOLN
3.0000 mL | INTRAMUSCULAR | Status: AC | PRN
Start: 2022-12-05 — End: 2022-12-05
  Administered 2022-12-05: 3 mL

## 2022-12-05 NOTE — Progress Notes (Signed)
The patient is a 42 year old female that I am seeing for the first time.  She does have an interpreter with her.  She has been dealing with chronic bilateral knee pain for several years now but has been getting worse for the last 6 months with her left knee hurting worse than the right and at that the medial aspect of both knees her knees as a source of her pain.  She has never had surgery on her knees and never had any type of injections.  She does take Naprosyn.  She is obese in terms of her only significant comorbidity.  She does work standing quite a bit as well.  Examination of both knees shows no significant malalignment.  She does have medial joint line tenderness bilaterally and some patellofemoral pain and crepitation.  Both knees have full range of motion and are ligamentously stable.  X-rays of both knees show some slight medial joint space narrowing and slight varus malalignment.  I have recommended steroid injections in both her knees.  I would like her to try Celebrex instead of naproxen and I did send this in for her.  We talked about weight loss and quad strengthening exercises.  I did place a steroid injection in both knees which she tolerated well.  Will see her back in about 6 weeks to see how the combination of those injections and Celebrex is helping for her.  I think weight loss would help quite a bit as well.  All questions and concerns were answered and addressed.     Procedure Note  Patient: Janet Cardenas             Date of Birth: Oct 16, 1980           MRN: [id number]             Visit Date: 12/05/2022  Procedures: Visit Diagnoses:  1. Chronic pain of left knee   2. Chronic pain of right knee     Large Joint Inj: R knee on 12/05/2022 3:49 PM Indications: diagnostic evaluation and pain Details: 22 G 1.5 in needle, superolateral approach  Arthrogram: No  Medications: 3 mL lidocaine 1 %; 40 mg methylPREDNISolone acetate 40 MG/ML Outcome: tolerated  well, no immediate complications Procedure, treatment alternatives, risks and benefits explained, specific risks discussed. Consent was given by the patient. Immediately prior to procedure a time out was called to verify the correct patient, procedure, equipment, support staff and site/side marked as required. Patient was prepped and draped in the usual sterile fashion.    Large Joint Inj: L knee on 12/05/2022 3:49 PM Indications: diagnostic evaluation and pain Details: 22 G 1.5 in needle, superolateral approach  Arthrogram: No  Medications: 3 mL lidocaine 1 %; 40 mg methylPREDNISolone acetate 40 MG/ML Outcome: tolerated well, no immediate complications Procedure, treatment alternatives, risks and benefits explained, specific risks discussed. Consent was given by the patient. Immediately prior to procedure a time out was called to verify the correct patient, procedure, equipment, support staff and site/side marked as required. Patient was prepped and draped in the usual sterile fashion.

## 2022-12-22 ENCOUNTER — Encounter: Payer: Self-pay | Admitting: Internal Medicine

## 2022-12-22 ENCOUNTER — Ambulatory Visit: Payer: Self-pay | Admitting: Internal Medicine

## 2022-12-22 VITALS — BP 110/62 | HR 72 | Resp 16 | Ht 64.5 in | Wt 256.0 lb

## 2022-12-22 DIAGNOSIS — F419 Anxiety disorder, unspecified: Secondary | ICD-10-CM

## 2022-12-22 DIAGNOSIS — F410 Panic disorder [episodic paroxysmal anxiety] without agoraphobia: Secondary | ICD-10-CM

## 2022-12-22 MED ORDER — ESCITALOPRAM OXALATE 10 MG PO TABS: 10.0000 mg | ORAL_TABLET | Freq: Every day | ORAL | 2 refills | Status: DC

## 2022-12-22 NOTE — Progress Notes (Signed)
    Subjective:    Patient ID: Janet Cardenas, female   DOB: 1981-02-11, 42 y.o.   MRN: [id number]   HPI  Janet Cardenas interprets   Anxiety and Panic attacks:  Discussed at CPE in January.  She never followed up with Morene Antu, LCSWA.  States she prefers to counsel with a Spanish Clinical cytogeneticist. Has episodes where she feels something in her throat as if she needs to cry.  Wants to be alone, doesn't want to go anywhere.   + palpitations, tremors, chest discomfort, dyspnea.   Two episodes per week. Lasts until she goes to bed and sleeps.  Symptoms can last 30 to 60 minutes--goes and lies down.  Causing her significant issues currently.   Also depressed and has thought about harming herself.  Was thinking about committing suicide by overdosing on her medication she has.  Was planning to overdose on her NSAIDS.  States she had had a discussion with her son regarding his wife and MIL.  She does not feel he cares for her as he does his wife and MIL.  She states he rejects her.     Current Meds  Medication Sig   acetaminophen (TYLENOL) 500 MG tablet Take 500 mg by mouth every 6 (six) hours as needed.   celecoxib (CELEBREX) 200 MG capsule Take 1 capsule (200 mg total) by mouth 2 (two) times daily between meals as needed.   Menthol-Methyl Salicylate (ICY HOT BALM EXTRA STRENGTH EX) Apply topically.   Naproxen Sodium (ALEVE PO) Take by mouth as needed.   No Known Allergies   Review of Systems    Objective:   BP 110/62 (BP Location: Right Arm, Patient Position: Sitting, Cuff Size: Normal)   Pulse 72   Resp 16   Ht 5' 4.5" (1.638 m)   Wt 256 lb (116.1 kg)   LMP 12/12/2022   BMI 43.26 kg/m   Physical Exam NAD Tearful at times Lungs:  CTA CV:  RRR  Assessment & Plan  Panic disorder, likely depression and anxiety:  urged her to work with one of our counselors.  Warm hand off to SBT, LCSWA, who will work with her to find a Spanish speaking counselor if  does not work out with her. Escitalopram 10 mg daily.

## 2022-12-30 ENCOUNTER — Ambulatory Visit: Payer: Self-pay | Admitting: Psychology

## 2022-12-30 DIAGNOSIS — F339 Major depressive disorder, recurrent, unspecified: Secondary | ICD-10-CM

## 2022-12-30 DIAGNOSIS — F419 Anxiety disorder, unspecified: Secondary | ICD-10-CM

## 2022-12-30 DIAGNOSIS — F410 Panic disorder [episodic paroxysmal anxiety] without agoraphobia: Secondary | ICD-10-CM

## 2022-12-30 DIAGNOSIS — Z09 Encounter for follow-up examination after completed treatment for conditions other than malignant neoplasm: Secondary | ICD-10-CM

## 2023-01-10 ENCOUNTER — Other Ambulatory Visit: Payer: Self-pay | Admitting: Psychology

## 2023-01-16 ENCOUNTER — Ambulatory Visit (INDEPENDENT_AMBULATORY_CARE_PROVIDER_SITE_OTHER): Payer: Self-pay | Admitting: Orthopaedic Surgery

## 2023-01-16 ENCOUNTER — Encounter: Payer: Self-pay | Admitting: Orthopaedic Surgery

## 2023-01-16 DIAGNOSIS — M25561 Pain in right knee: Secondary | ICD-10-CM

## 2023-01-16 DIAGNOSIS — M25562 Pain in left knee: Secondary | ICD-10-CM

## 2023-01-16 DIAGNOSIS — G8929 Other chronic pain: Secondary | ICD-10-CM

## 2023-01-16 MED ORDER — METHOCARBAMOL 500 MG PO TABS: 500.0000 mg | ORAL_TABLET | Freq: Three times a day (TID) | ORAL | 1 refills | Status: DC | PRN

## 2023-01-16 NOTE — Progress Notes (Signed)
The patient is a pleasant 42 year old non-English-speaking female who comes in for further evaluation and treatment of bilateral chronic knee pain.  She has a BMI of 43.  She has valgus malalignment of her knees but only mild arthritis on x-ray findings.  I did place a steroid injection in both knees at her last visit and put her on Celebrex.  She said the injection helped but she has been having bilateral knee pain from about 3 days now.  Through interpreter we talked about weight loss strategies.  I would like her to see her primary care physician again about discussing other options for weight loss.  She has already been through physical therapy.  On exam of both her knees today both knees have valgus malalignment but no effusion and both knees have good motion with no instability.  I would like to try a short course of methocarbamol to see if this will help and I have recommended over-the-counter knee sleeves from [employer].  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  Will see her back in about 6 weeks from now because we can always repeat steroid injections then if needed.  All questions and concerns were answered and addressed the interpreter.

## 2023-02-07 ENCOUNTER — Ambulatory Visit: Payer: Self-pay | Admitting: Internal Medicine

## 2023-02-21 NOTE — Progress Notes (Signed)
Case Management The client is doing better with the help of the medicine she was put on by Dr. Delrae Alfred. The client reports, "I can tell I am feeling better even though it is early." The social work team will provide a list of Spanish speaking therapists as requested by the client. Next week we will call with the list of therapists and also to check in and make sure that the medicine is continuing to work and that the client is continuing to feel better.

## 2023-02-27 ENCOUNTER — Ambulatory Visit (INDEPENDENT_AMBULATORY_CARE_PROVIDER_SITE_OTHER): Payer: Self-pay | Admitting: Orthopaedic Surgery

## 2023-02-27 ENCOUNTER — Encounter: Payer: Self-pay | Admitting: Orthopaedic Surgery

## 2023-02-27 VITALS — Ht 64.5 in | Wt 260.0 lb

## 2023-02-27 DIAGNOSIS — M25562 Pain in left knee: Secondary | ICD-10-CM

## 2023-02-27 DIAGNOSIS — M25561 Pain in right knee: Secondary | ICD-10-CM

## 2023-02-27 DIAGNOSIS — G8929 Other chronic pain: Secondary | ICD-10-CM

## 2023-02-27 MED ORDER — LIDOCAINE HCL 1 % IJ SOLN
3.0000 mL | INTRAMUSCULAR | Status: AC | PRN
Start: 2023-02-27 — End: 2023-02-27
  Administered 2023-02-27: 3 mL

## 2023-02-27 MED ORDER — METHYLPREDNISOLONE ACETATE 40 MG/ML IJ SUSP
40.0000 mg | INTRAMUSCULAR | Status: AC | PRN
Start: 2023-02-27 — End: 2023-02-27
  Administered 2023-02-27: 40 mg via INTRA_ARTICULAR

## 2023-02-27 NOTE — Progress Notes (Signed)
The patient is a very pleasant 42 year old female who comes in today for injections in both her knees with steroid.  She is a patient of Dr. Julieanne Manson and I have seen the patient in the past for arthritic knees.  We have had her try to work on weight loss.  Her last recorded BMI is 43.94.  She has had no acute change in her medical status but she does have chronic pain of her shoulders hands and wrists as well as her heel.  She is wearing open back crocs which can significantly contribute to heel pain.  This can affect her knees as well but also her obesity has put a lot of wear on her knees.  Both knees have valgus malalignment.  She does state that the steroid injections have helped enough tried Celebrex for her as an anti-inflammatory.  Both knees have valgus malalignment and hyperextend with good range of motion.  Both knees are exhibit pain throughout the arc of motion.  Neither knee has an effusion.  I did place a steroid injection in both knees that she tolerated very well.  She will continue anti-inflammatories and I do feel that she would be someone who needs to follow-up with Dr. Delrae Alfred again in the near future so they can look at the possibility of other rheumatologic conditions that are contributing to multiple joint aches and complaints.  She knows to wait at least 3 to 4 months between steroid injections and I talked her about again about wearing closed back shoes that are supportive and this could help with her knees and her heel pain as well.    Procedure Note  Patient: Janet Cardenas             Date of Birth: [birth year]           MRN: [id number]             Visit Date: 02/27/2023  Procedures: Visit Diagnoses:  1. Chronic pain of left knee   2. Chronic pain of right knee     Large Joint Inj: L knee on 02/27/2023 11:15 AM Indications: diagnostic evaluation and pain Details: 22 G 1.5 in needle, superolateral approach  Arthrogram: No  Medications: 3  mL lidocaine 1 %; 40 mg methylPREDNISolone acetate 40 MG/ML Outcome: tolerated well, no immediate complications Procedure, treatment alternatives, risks and benefits explained, specific risks discussed. Consent was given by the patient. Immediately prior to procedure a time out was called to verify the correct patient, procedure, equipment, support staff and site/side marked as required. Patient was prepped and draped in the usual sterile fashion.    Large Joint Inj: R knee on 02/27/2023 11:15 AM Indications: diagnostic evaluation and pain Details: 22 G 1.5 in needle, superolateral approach  Arthrogram: No  Medications: 3 mL lidocaine 1 %; 40 mg methylPREDNISolone acetate 40 MG/ML Outcome: tolerated well, no immediate complications Procedure, treatment alternatives, risks and benefits explained, specific risks discussed. Consent was given by the patient. Immediately prior to procedure a time out was called to verify the correct patient, procedure, equipment, support staff and site/side marked as required. Patient was prepped and draped in the usual sterile fashion.

## 2023-05-25 NOTE — Progress Notes (Signed)
Not seen

## 2023-06-14 ENCOUNTER — Telehealth: Payer: Self-pay

## 2023-06-14 NOTE — Telephone Encounter (Signed)
 Patient needs an appointment for heel pain on both feet that she has had for about a month. Pain getting progressively worse. Patient has been added to the wait list for next available appointment.

## 2023-06-15 NOTE — Telephone Encounter (Signed)
 Called patient to offer appointment, patient did not take.

## 2023-06-22 ENCOUNTER — Ambulatory Visit: Payer: Self-pay | Admitting: Internal Medicine

## 2023-06-22 ENCOUNTER — Encounter: Payer: Self-pay | Admitting: Internal Medicine

## 2023-06-22 VITALS — BP 114/70 | HR 83 | Resp 20 | Ht 64.5 in | Wt 268.0 lb

## 2023-06-22 DIAGNOSIS — F339 Major depressive disorder, recurrent, unspecified: Secondary | ICD-10-CM

## 2023-06-22 DIAGNOSIS — M722 Plantar fascial fibromatosis: Secondary | ICD-10-CM | POA: Insufficient documentation

## 2023-06-22 DIAGNOSIS — F419 Anxiety disorder, unspecified: Secondary | ICD-10-CM

## 2023-06-22 DIAGNOSIS — F410 Panic disorder [episodic paroxysmal anxiety] without agoraphobia: Secondary | ICD-10-CM

## 2023-06-22 MED ORDER — ESCITALOPRAM OXALATE 10 MG PO TABS: 10.0000 mg | ORAL_TABLET | Freq: Every day | ORAL | 11 refills | Status: AC

## 2023-06-22 MED ORDER — CELECOXIB 200 MG PO CAPS: 200.0000 mg | ORAL_CAPSULE | Freq: Two times a day (BID) | ORAL | 1 refills | Status: AC | PRN

## 2023-06-22 NOTE — Progress Notes (Signed)
    Subjective:    Patient ID: Janet Cardenas, female   DOB: November 19, 1980, 43 y.o.   MRN: [id number]   HPI  Tereasa Coop interprets   Bilateral foot pain:  started about two months ago.  Points to heels as main source of pain, but extends to metatarsal heads.  States not on feet a lot save to do housework, but does have hard floors. Was wearing flip flops prior to the pain occurring, but now wearing Crocs currently as feet feel better in them.   She states she has worn flip flops before without pain, but weighed much less than.   Pain is worse when first gets up in the morning.   2.  Panic Disorder/anxiety/depression:  "finished"  the Escitalopam.  Only took for one months.  She did not follow up as recommended.  She went to counseling once and not clear a Spanish speaker was found for counseling.   States she went back to church and that has helped, but is still having anxiety episodes. Has worked things out with her son.    Current Meds  Medication Sig   acetaminophen (TYLENOL) 500 MG tablet Take 500 mg by mouth every 6 (six) hours as needed.   Menthol-Methyl Salicylate (ICY HOT BALM EXTRA STRENGTH EX) Apply topically.   No Known Allergies   Review of Systems    Objective:   BP 114/70 (BP Location: Right Arm, Patient Position: Sitting, Cuff Size: Normal)   Pulse 83   Resp 20   Ht 5' 4.5" (1.638 m)   Wt 268 lb (121.6 kg)   LMP 06/04/2023 (Exact Date)   BMI 45.29 kg/m   Physical Exam Morbidly obese NAD Tender over plantar surface, most prominently over heels, but extends over arch to metatarsal heads across entire foot. No erythema or swelling.   Assessment & Plan   Bilateral plantar fasciitis:  no bare or stocking feet.  Heel cups.  No flip flops or thin soled shoes--always cushioned soles.  Celebrex worked best for her knee pain from ortho.  Stretches given.  Referral to [facility] PT.    2.  Panic disorder, anxiety, depression:  Escitalopram 10 mg  daily restart.  Follow up in 6 weeks. To return to pharmacy monthly to refill.

## 2023-06-22 NOTE — Telephone Encounter (Signed)
 Patient has been scheduled

## 2023-08-02 ENCOUNTER — Telehealth: Payer: Self-pay

## 2023-08-02 NOTE — Telephone Encounter (Signed)
 Called patient to remind her of appointment,  Patient's appointment was for for Follow up of escitalopram  restart .  Patient stated she is taking medication, but needed to cancel appointment because she is currently out of town with a family member who is sick and does not know when she will back in town.  Patient stated she will call back once she is back to reschedule appointment.

## 2023-08-03 ENCOUNTER — Ambulatory Visit: Payer: Self-pay | Admitting: Internal Medicine

## 2023-10-11 ENCOUNTER — Ambulatory Visit (INDEPENDENT_AMBULATORY_CARE_PROVIDER_SITE_OTHER): Payer: Self-pay

## 2023-10-11 ENCOUNTER — Ambulatory Visit (HOSPITAL_COMMUNITY)
Admission: EM | Admit: 2023-10-11 | Discharge: 2023-10-11 | Disposition: A | Discharge status: Home / Self Care | Payer: Self-pay | Attending: Family Medicine | Admitting: Family Medicine

## 2023-10-11 ENCOUNTER — Encounter (HOSPITAL_COMMUNITY): Payer: Self-pay

## 2023-10-11 DIAGNOSIS — M79642 Pain in left hand: Secondary | ICD-10-CM

## 2023-10-11 HISTORY — DX: Rheumatoid arthritis, unspecified: M06.9

## 2023-10-11 HISTORY — DX: Anxiety disorder, unspecified: F41.9

## 2023-10-11 MED ORDER — AMOXICILLIN-POT CLAVULANATE 875-125 MG PO TABS: 1.0000 | ORAL_TABLET | Freq: Two times a day (BID) | ORAL | 0 refills | Status: AC

## 2023-10-11 MED ORDER — LIDOCAINE HCL 2 % IJ SOLN
INTRAMUSCULAR | Status: AC
  Filled 2023-10-11: qty 20

## 2023-10-11 NOTE — ED Triage Notes (Signed)
 Pt presents with right hand laceration. Pt states she was cooking about an hour ago trying to open a can of beans and hit it with a knife and the knife went all the way through. Pt presented with hand wrapped and self medicated with 2 pain relievers with no relief. Pain is remains 10/10

## 2023-10-11 NOTE — ED Provider Notes (Signed)
 [facility]   [id number] 10/11/23 Arrival Time: 1446  ASSESSMENT & PLAN:  1. Left hand pain    I have personally viewed and independently interpreted the imaging studies ordered this visit. L hand: no radiopaque FB appreciated; bones normal.  Meds ordered this encounter  Medications   amoxicillin-clavulanate (AUGMENTIN) 875-125 MG tablet    Sig: Take 1 tablet by mouth every 12 (twelve) hours.    Dispense:  14 tablet    Refill:  0   Procedure: Laceration Repair Verbal consent obtained. Patient provided with risks and alternatives to the procedure. Wound copiously irrigated with NS then cleansed with betadine. Local anesthesia: {ONRJO:83602}. Wound carefully explored. No foreign body, tendon injury, or nonviable tissue were noted. Using sterile technique, {NUMBERS; 0-10:5044} {Running/interrupted:12557} {ED SUTURE SIZE:11070} {Devices; suture material:11071} sutures were placed to reapproximate the wound. Procedure tolerated well. No complications. Minimal bleeding. Advised to look for and return for any signs of infection such as redness, swelling, discharge, or worsening pain. Return for suture removal in *** days.    Discharge Instructions      If not allergic, you may use over the counter acetaminophen  as needed for pain   Reviewed expectations re: course of current medical issues. Questions answered. Outlined signs and symptoms indicating need for more acute intervention. Patient verbalized understanding. After Visit Summary given.   SUBJECTIVE:  Janet Cardenas is a 43 y.o. female who presents with a laceration of her L thenar hand; today;   Td UTD: {Responses; yes/no/unknown/maybe/na:33144}.  ROS: As per HPI. Health Maintenance Due  Topic Date Due   Hepatitis B Vaccines (1 of 3 - 19+ 3-dose series) Never done   HPV VACCINES (1 - 3-dose SCDM series) Never done   COVID-19 Vaccine (3 - 2024-25 season) 12/11/2022    OBJECTIVE:  Vitals:    10/11/23 1527  BP: 109/72  Pulse: 80  Resp: 16  Temp: 98.3 F (36.8 C)  TempSrc: Oral  SpO2: 96%     General appearance: alert; no distress Skin: {Laceration shape:60549} laceration of ***; size: approx *** cm; {Laceration desc:5260}; {With/Without:20273::without} active bleeding Psychological: alert and cooperative; normal mood and affect  Results for orders placed or performed in visit on 09/08/22  Cervicovaginal ancillary only( [place])   Collection Time: 09/08/22  2:15 PM  Result Value Ref Range   Neisseria Gonorrhea Negative    Chlamydia Negative    Trichomonas Negative    Bacterial Vaginitis (gardnerella) Negative    Candida Vaginitis Negative    Candida Glabrata Negative    Comment      Normal Reference Range Bacterial Vaginosis - Negative   Comment Normal Reference Range Candida Species - Negative    Comment Normal Reference Range Candida Galbrata - Negative    Comment Normal Reference Range Trichomonas - Negative    Comment Normal Reference Ranger Chlamydia - Negative    Comment      Normal Reference Range Neisseria Gonorrhea - Negative    Labs Reviewed - No data to display  DG Hand Complete Left Result Date: 10/11/2023 CLINICAL DATA:  thenar stab wound EXAM: LEFT HAND - COMPLETE 3+ VIEW COMPARISON:  None Available. FINDINGS: No acute fracture or dislocation. There is no evidence of arthropathy or other focal bone abnormality. Soft tissues are unremarkable. No radiopaque foreign body. IMPRESSION: No acute fracture or dislocation. No radiopaque foreign body. Electronically Signed   By: Rogelia Myers M.D.   On: 10/11/2023 16:46    No Known Allergies  Past  Medical History:  Diagnosis Date   Anxiety    Depression    Gestational (pregnancy-induced) hypertension without significant proteinuria, complicating childbirth    Obesity (BMI 35.0-39.9 without comorbidity)    RBC microcytosis 04/01/2019   Rheumatoid arthritis (HCC)    Social History    Socioeconomic History   Marital status: Married    Spouse name: Hezzie Jo   Number of children: 3   Years of education: 6   Highest education level: Not on file  Occupational History   Occupation: Housewife   Occupation: Environmental health practitioner    Comment: Has not worked since 04/01/22 due to anxiety  Tobacco Use   Smoking status: Never    Passive exposure: Current (2023:  at work through vent in Pharmacologist with [employer] and cigarette smoke.  She has notified her boss.)   Smokeless tobacco: Never  Vaping Use   Vaping status: Never Used  Substance and Sexual Activity   Alcohol use: Never   Drug use: Never   Sexual activity: Yes    Birth control/protection: Surgical  Other Topics Concern   Not on file  Social History Narrative   Lives at home with husband and children.   Oldest son now lives separately.   Social Drivers of Corporate investment banker Strain: Low Risk  (04/13/2022)   Overall Financial Resource Strain (CARDIA)    Difficulty of Paying Living Expenses: Not hard at all  Food Insecurity: No Food Insecurity (09/09/2022)   Hunger Vital Sign    Worried About Running Out of Food in the Last Year: Never true    Ran Out of Food in the Last Year: Never true  Transportation Needs: No Transportation Needs (09/09/2022)   PRAPARE - Administrator, Civil Service (Medical): No    Lack of Transportation (Non-Medical): No  Physical Activity: Inactive (11/29/2018)   Exercise Vital Sign    Days of Exercise per Week: 0 days    Minutes of Exercise per Session: 0 min  Stress: Not on file  Social Connections: Unknown (11/29/2018)   Social Connection and Isolation Panel    Frequency of Communication with Friends and Family: Not on file    Frequency of Social Gatherings with Friends and Family: Not on file    Attends Religious Services: Not on file    Active Member of Clubs or Organizations: Not on file    Attends Banker Meetings: Not on file    Marital Status: Married

## 2023-10-11 NOTE — Discharge Instructions (Addendum)
If not allergic, you may use over the counter acetaminophen as needed for pain.

## 2023-10-12 ENCOUNTER — Ambulatory Visit: Payer: Self-pay | Admitting: Internal Medicine

## 2023-10-12 VITALS — BP 120/80 | HR 80 | Resp 14 | Ht 64.5 in | Wt 269.0 lb

## 2023-10-12 DIAGNOSIS — G8929 Other chronic pain: Secondary | ICD-10-CM

## 2023-10-12 DIAGNOSIS — M722 Plantar fascial fibromatosis: Secondary | ICD-10-CM

## 2023-10-12 DIAGNOSIS — F410 Panic disorder [episodic paroxysmal anxiety] without agoraphobia: Secondary | ICD-10-CM

## 2023-10-12 MED ORDER — FAMOTIDINE 20 MG PO TABS: ORAL_TABLET | ORAL | 1 refills | Status: AC

## 2023-10-12 NOTE — Progress Notes (Unsigned)
 Subjective:    Patient ID: Janet Cardenas, female   DOB: [birth year], 43 y.o.   MRN: [id number]   HPI  Janet Cardenas interprets   Stab wound to web of left thenar eminence with exit in mid palm:  occurred yesterday when trying to open a can of beans and decided to use a knife to punch a hole in the can.  Was seen in Urgent care and entry wound with 2 stitches.  She is trying to elevate, though appears she is keeping her hand down on lap today.  She is not covering as bandage came off.  Tdap given in 2019.  She is taking Augmentin for 7 day course as well.  Some pain from injury.  She did not take her morning dose today as she has not eatan  2.  Plantar fasciitis:  Has been to PT twice, being seen every Wednesday.  Reportedly, PT called our office, recommending ?Rheumatology referral.  No information as to why rheumatology.  Called PT clinic and left message for more information.  Patient finally explains, they saw swelling below her medial bilateral knees in PT.  States she has had this for 2 years--comes and goes.  Generally, does not have associated erythema--states maybe once.   Patient was evaluated for this first 09/29/2021 with Xrays showing no changes associated with inflammatory arthritis, though joint narrowing.  ANA and RF were both negative.   Sed rate was minimally elevated at 37 with high normal at 32.   She has been seen by Dr. Vernetta, ortho, who has injected her knees with good results.   She is having morning discomfort in her knees.  Takes Tylenol , which helps.    3.  For 3 weeks:  Anterior throat pain--points to anterior neck area, but states pain is inside.  Also noting sores on tongue.   When she eats or in morning, wakes with nausea--relates this has been for 3 months.   Eating only once daily.   Admits she was not taking Celebrex  with food also.   Not clear if her GI symptoms started after taking Celebrex .   Point to LUQ as area of discomfort as  well Feels hot and sweats at night.   Periods are very irregular now--for past 1 year.  Has not had a month when she has completely stopped.    4.  Panic Disorder:  stopped escitalopram  as she thought she ran out.  Current Meds  Medication Sig  . acetaminophen  (TYLENOL ) 500 MG tablet Take 500 mg by mouth every 6 (six) hours as needed.  SABRA amoxicillin-clavulanate (AUGMENTIN) 875-125 MG tablet Take 1 tablet by mouth every 12 (twelve) hours.   No Known Allergies   Review of Systems    Objective:   BP 120/80 (BP Location: Right Arm, Patient Position: Sitting, Cuff Size: Large)   Pulse 80   Resp 14   Ht 5' 4.5 (1.638 m)   Wt 269 lb (122 kg)   BMI 45.46 kg/m   Physical Exam 2 sutures closing entry wound in dorsal left thenar web with contusion mid palm where knife tip reportedly exited.  Wound is open to air and patient not keeping elevated  Lipomatous tissue at medial proximal lower leg bilaterally.  No erythema and NT  Assessment & Plan   Left hand wound:  complete antibiotics.  Go to UC over weekend if signs or symptoms of infection.  Return end of next week for suture removal   Knee  DJD and plantar fasciitis:  Wearing TEVA like sandals today.

## 2023-10-20 ENCOUNTER — Ambulatory Visit: Payer: Self-pay | Admitting: Internal Medicine

## 2024-02-12 ENCOUNTER — Encounter: Payer: Self-pay | Admitting: Radiology
